# Patient Record
Sex: Female | Born: 1970 | Race: White | Hispanic: No | Marital: Single
Health system: Midwestern US, Academic
[De-identification: ages and names within clinical notes are randomized; demographics above are authoritative.]

## PROBLEM LIST (undated history)

## (undated) LAB — RENAL FUNCTION PANEL W/O EGFR
BUN: 20 mg/dL (ref 4–21)
Calcium: 8.7 mg/dL (ref 8.7–10.7)
Chloride: 104 mmol/L (ref 99–108)
Creatinine: 0.8
Potassium: 4.3 mmol/L (ref 3.4–5.3)
Sodium: 137 mmol/L (ref 137–147)

## (undated) LAB — TSH: TSH: 1.85 u[IU]/mL (ref 0.41–5.90)

## (undated) LAB — GLUCOSE, RANDOM: Glucose: 100 mg/dL (ref 60–200)

## (undated) LAB — HEMOGLOBIN A1C: Hemoglobin A1C: 5.1 % (ref 4.0–6.0)

## (undated) LAB — HM PAP SMEAR

## (undated) LAB — LIPID PANEL
Cholesterol, Total: 152
HDL: 51 mg/dL (ref 35–70)
LDL Calculated: 76 mg/dL (ref 0–160)
Triglycerides: 126 mg/dL (ref 40–160)

## (undated) LAB — HOMOCYSTEINE, SERUM: Homocysteine: 2.7

---

## 2004-10-17 NOTE — Unmapped (Signed)
Signed by Shona Needles MD on 10/17/2004 at 00:00:00  Advanced Beneficiary Notice      Imported By: Coletta Memos 05/06/2005 14:10:31    _____________________________________________________________________    External Attachment:    Please see [employer] EMR for this document.

## 2004-11-05 NOTE — Unmapped (Signed)
Signed by Shona Needles MD on 11/05/2004 at 00:00:00  Audiologic Evaluation      Imported By: Coletta Memos 02/22/2005 10:21:49    _____________________________________________________________________    External Attachment:    Please see [employer] EMR for this document.

## 2004-12-19 NOTE — Unmapped (Signed)
Signed by Shona Needles MD on 12/19/2004 at 00:00:00  Audiologic Evaluation      Imported By: Coletta Memos 05/06/2005 14:00:19    _____________________________________________________________________    External Attachment:    Please see [employer] EMR for this document.

## 2004-12-19 NOTE — Unmapped (Signed)
Signed by Shona Needles MD on 12/19/2004 at 00:00:00  [facility]      Imported By: Coletta Memos 02/22/2005 10:21:31    _____________________________________________________________________    External Attachment:    Please see [employer] EMR for this document.

## 2004-12-19 NOTE — Unmapped (Signed)
Signed by Shona Needles MD on 12/19/2004 at 00:00:00  [facility]      Imported By: Coletta Memos 02/22/2005 10:21:04    _____________________________________________________________________    External Attachment:    Please see [employer] EMR for this document.

## 2004-12-19 NOTE — Unmapped (Signed)
Signed by Shona Needles MD on 12/19/2004 at 00:00:00  Audiologic Evaluation      Imported By: Coletta Memos 02/22/2005 10:21:40    _____________________________________________________________________    External Attachment:    Please see [employer] EMR for this document.

## 2004-12-22 NOTE — Unmapped (Signed)
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

## 2005-01-27 NOTE — Unmapped (Signed)
Signed by Shona Needles MD on 02/02/2005 at 12:31:29  Patient: Nicole Chen  Note: All result statuses are Final unless otherwise noted.    Tests: (1) CT-ORB/SELLA/IAC/PFOSSA W/O C ([id number])    Order NotePricilla Handler Order Number: [id number]    Order Note:     *** VERIFIED ***  [facility]  Reason:  RT CHOLOSTEATOMA  Dict.Staff: Zachery Conch    Verified By: Zachery Conch       Ver: 01/27/05   3:43 pm  Exams:  CT-ORB/SELLA/IAC/PFOSSA W/O C      CT temporal bones without contrast 01/27/2005.    Clinical Indication: Right cholesteatoma.    Findings:    The patient was unable to be positioned for coronal imaging and  coronal reconstructions were performed. The direct axial images  are moderately contaminated by motion artifacts, which causes  essentially nondiagnostic coronal multiplanar reconstructions.    The bilateral mastoid air cells are markedly underpneumatized  and completely opacified. There is subtotal opacification of the  bilateral middle ear cavities including the middle ear recesses,  surrounding the ossicles without definite destruction. The  bilateral tegmen tympani appears thinned bilaterally, asymmetric  on the right with defects suspected.    No focal masses are appreciated. No definite otic capsule  defects are seen. No definite significant intracranial  abnormalities are included on this examination.    Impression:    Limited study demonstrating extensive opacification of the  bilateral mastoid and middle ear cavities likely due to chronic  inflammatory disease. Components of cholesteatoma are not  excluded. Bilateral tegmen tympani defects are suspected, but  poorly characterized due to the degree of motion artifacts.  **** end of result ****    Note: An exclamation mark (!) indicates a result that was not dispersed into   the flowsheet.  Document Creation Date: 01/27/2005 3:41 PM  _______________________________________________________________________    (1) Order result status:  Final  Collection or observation date-time: 01/27/2005 07:01:00  Requested date-time: 01/27/2005 07:01:00  Receipt date-time:   Reported date-time: 01/27/2005 15:43:26  Referring Physician: Cindi Carbon NON-STAFF  Ordering Physician: Chauncey Cruel [facility])  Specimen Source:   Source: Duanne Guess Order Number: [id number]  Lab site: [facility]

## 2005-01-27 NOTE — Unmapped (Signed)
Signed by Shona Needles MD on 01/27/2005 at 00:00:00  [facility]      Imported By: Coletta Memos 05/06/2005 14:09:03    _____________________________________________________________________    External Attachment:    Please see [employer] EMR for this document.

## 2005-02-08 NOTE — Unmapped (Signed)
Signed by Shona Needles MD on 02/08/2005 at 00:00:00  [facility] [employer]      Imported By: Coletta Memos 05/06/2005 14:09:22    _____________________________________________________________________    External Attachment:    Please see [employer] EMR for this document.

## 2005-03-04 NOTE — Unmapped (Signed)
Signed by Ellyn Hack on 03/04/2005 at 10:43:34    Clinical Lists Changes    Orders:  Added new Test order of MRI, Brain/ Brain Stem w/ contrast ([id number]) - Signed

## 2005-03-31 NOTE — Unmapped (Signed)
Signed by   [employer] on 04/01/2005 at 00:48:35  Patient: Nicole Chen  Note: All result statuses are Final unless otherwise noted.    Tests: (1)  (MR)    Order Note:                                      THE [facility]     PATIENT NAME:   ALLYANNA, APPLEMAN                   MR #:  [id number]  DATE OF BIRTH:  [birth year]                         ACCOUNT #:  [id number]  ADMITTING:      Kathi Ludwig A. Ninfa Linden, M.D.                ROOM #:  ATTENDING:                                         NURSING UNIT:  PRIMARY:        [name], M.D.                FC:  REFERRING:      Chauncey Cruel, M.D.                    ADMIT DATE:  03/31/2005  DICTATED BY:    Rosemarie Beath, C.N.P.             DISCHARGE DATE:                                  HISTORY & PHYSICAL     ATTENDING PHYSICIAN:  Chauncey Cruel, M.D.     CHIEF COMPLAINT:  The patient has Down's syndrome.  Most information received  today from her mother, although the patient is very interactive and  understands she is having surgery.  The mother states she is having surgery  on her right ear.     HISTORY OF PRESENT ILLNESS:  This is a 34 year old white female who has had  hearing aids since 89.  She went in for a routine ear exam and hearing  test.  She was then referred to Dr. Orlin Hilding in August, 2006.  She had a CT scan  in October, 2006 and was diagnosed with a cholesteatoma.  The patient also  has had complaints of intermittent headaches since October, 2006.  No  previous ear surgeries other than multiple PE tubes as a child.     PAST SURGICAL HISTORY:  Other than above:     1.  Heart defect repaired in 1972.     PAST MEDICAL HISTORY:  None other than above.     SOCIAL HISTORY:  The patient has Down's syndrome, moderate functioning.  She  is able to live in an apartment upstairs from her mother.  She did complete  MRDD schooling.  She is unable to read, however.  She does work at [employer] doing [employer].  She is a non-smoker.  No use of alcohol.  No use  of  recreational drugs.     FAMILY HISTORY:  None.     REVIEW OF  SYSTEMS:  The patient currently denies any chest pain, shortness of  breath, GI or GU complaints.     ALLERGIES TO MEDICATIONS:  None.     CURRENT MEDICATIONS:  None.     PHYSICAL EXAMINATION:     VITAL SIGNS:  Blood pressure 113/57, pulse 77, respirations 18, 02 sat 100%.  She is 4'8 tall, weighs 118 pounds.  GENERAL:  Well-developed, normal nutrition.  No acute distress.  She appears  very well cared for.  MENTAL STATUS:  Alert and oriented x three.  Cooperative.  CO-MORBID CONDITIONS:  Down's syndrome.  HEENT:  Normocephalic.  Pupils equal, round, reactive to light.  Oropharynx  is pink and moist.  Teeth are in good repair.  Wears bilateral hearing aids.  NECK:  Supple.  No lymphadenopathy.  No thyroid enlargement.  CHEST:  Lungs are clear to percussion and auscultation.  BREASTS:  Not assessed.  CARDIAC EXAM:  Regular rate and rhythm.  No murmurs, rubs or gallops.  ABDOMEN:  Soft.  Positive bowel sounds.  No tenderness.  RECTAL:  Not assessed.  EXTREMITIES:  No gross deformity.  Positive radial pulses.  No clubbing,  cyanosis or edema.  SKIN:  Warm and dry.  NEUROLOGICAL EXAM:  Grossly intact.     IMPRESSION:     1.  This is a 34 year old white female with cholesteatoma for right       tympanomastoidectomy and canal reconstruction.     PLAN:     1.  Continue with surgery as planned.                                              ________________________________________  MP/fm                                 ____  D:  03/31/2005 17:32                  Chauncey Cruel, M.D.  T:  04/01/2005 00:36                  Dictated by:  Rosemarie Beath, C.N.P.  Job #:  [id number]                                     HISTORY & PHYSICAL                                        COPY                   PAGE    1 of 1    Note: An exclamation mark (!) indicates a result that was not dispersed into   the flowsheet.  Document Creation Date: 04/01/2005 12:48  AM  _______________________________________________________________________    (1) Order result status: Final  Collection or observation date-time: 03/31/2005 00:00  Requested date-time:   Receipt date-time:   Reported date-time:   Referring Physician:    Ordering Physician:  Reviewed In Hospital [facility])  Specimen Source:   Source: DBS  Filler Order Number: [id number]  Lab site:

## 2005-04-08 NOTE — Unmapped (Signed)
Signed by   [employer] on 04/09/2005 at 12:06:59  Patient: Nicole Chen  Note: All result statuses are Final unless otherwise noted.    Tests: (1)  (MR)    Order Note:                                      THE [facility]     PATIENT NAME:   Nicole Chen, Nicole Chen                   MR #:  [id number]  DATE OF BIRTH:  26-Sep-1970                         ACCOUNT #:  [id number]  SURGEON:        Chauncey Cruel, M.D.                    ROOM #:  [phone]  SERVICE:        Otolaryngology                     NURSING UNIT:  [email or web address]  PRIMARY:        [name], M.D.                FCJudie Petit  REFERRING:      Chauncey Cruel, M.D.                    ADMIT DATE:  04/08/2005  DICTATED BY:    Chauncey Cruel, M.D.                    SURGERY DATE:                                                     DISCHARGE DATE:  04/09/2005                                    OPERATIVE REPORT           PRIMARY CARE PHYSICIAN:  [name] M.D.     PREOPERATIVE DIAGNOSIS(ES):     1.  Right cholesteatoma.  2.  Right conductive hearing loss.  3.  Left eustachian tube dysfunction.  4.  Left serous otitis media.  5.  Left conductive hearing loss.     POSTOPERATIVE DIAGNOSIS(ES):     1.  Right cholesteatoma.  2.  Right conductive hearing loss.  3.  Left eustachian tube dysfunction.  4.  Left serous otitis media.  5.  Left conductive hearing loss.     PROCEDURES PERFORMED:     1.  Left PE tube placement with Goode T tube.  2.  Right canal wall down tympanomastoidectomy.  3.  Canal wall reconstruction/canalplasty, cranial bone graft.  4.  Mastoid obliteration.  5.  Operative microscope.  6.  Facial nerve monitoring.     ANESTHESIA:   General endotracheal, local 1% lidocaine with 1:100,000  epinephrine.     SURGEON:  Chauncey Cruel MD     RESIDENT:  Kathy Breach, M.D.     DRAINSLolita Cram drain.     SPECIMENS:  Right middle ear mastoid contents for permanent section.     ESTIMATED BLOOD LOSS:  <50 cc.     COMPLICATIONS:  None.     INDICATIONS FOR PROCEDURE:     The patient is a  34 year old Caucasian female referred by Dr. Gweneth Dimitri to me for  evaluation of her ears. Audiogram revealed bilateral conductive hearing loss.  Clinical exam was significant for left eustachian tube dysfunction and serous  otitis media and on the right side, there was the concern of a cholesteatoma  pocket involving the posterior aspect of the pars tensa and extending up to a  portion of the pars flaccida.  CT scan and MRI scan were obtained.  Mother  provided consent for the procedure.  She understood that the risks of surgery  were less than the  risks of untreated cholesteatoma.  The risks of surgery  discussed included no guarantees of success, need for further  surgery/therapy, recurrence of ear disease, meningitis, brain fluid leak,  facial nerve paralysis, deafness, dizziness, tinnitus, taste changes, wound  healing problems as well as other unforeseen problems and complications and  despite the potential risks and complications, the patient and her mother  decided to proceed with surgery.     OPERATIVE FINDINGS:     1.  Left tympanic membrane atelectatic with serous middle ear effusion.  2.  Hypopneumotized/sclerotic mastoid on the right.  3.  Tegmen tympani dehiscence approximately 2 mm in size overlying the  head  of the malleus.  4.  Malleus removed.  5.  Incus eroded to the level of the body.  6.  Right tympanomeatal flap atelectatic.  7.  Facial nerve dehiscence, tympanic segment.  8.  Cholesteatoma involving the posterior aspect of the superior pars tensa  and posterior pars flaccida.  9.  See procedure in detail section below for more details.     DESCRIPTION OF OPERATION:     The patient was taken from the preop holding area to the operating room where  she was placed supine on the operating room table.  The patient was intubated  and once the endotracheal tube was secured, the table was rotated 180  degrees.  STDs were placed on her legs.     First her left ear was examined with the  microscope and  intraoperative  findings as described above.  A Goode T tube was placed with some slight  difficulty due to atelectasis of the tympanic membrane.   Serous middle ear  effusion was then aspirated. Floxin otic drops were placed.     Attention was then turned to the right ear, the hair shaved posterior and  superior to the right auricle.  The proposed incision site was marked with a  marking pen one fingerbreadth behind the posterior auricular sulcus.  The ear  canal was also injected.  Facial nerve monitoring needle electrodes were  applied.  The patient was given a dose of prophylactic IV antibiotics and  steroids.  She was not kept paralyzed throughout the procedure so we could  monitor facial nerve function.  She was prepped and draped in the usual  sterile fashion after being adequately padded, belted and test rolled on the  table.     A 15 blade scalpel was used to make an incision down through the skin to the  level of the temporalis fascia.  Two grafts were harvested and allowed to  dry.  Weitlaners were used to retract the soft tissue.  The operating  microscope  was brought in to view the surgical field.  Cutting core and fine  diamond burs were used as needed throughout the procedure.  A cranial bone graft was harvested from the squamosa and retrosigmoid area of  the temporal bones.  Since the mastoid was not well aerated, a lot of cranial  bone graft could be harvested without difficulty.  The cranial bone graft was  held in antibiotic irrigation.     A complete mastoidectomy was performed.  The digastric was identified.  The  sigmoid sinus was identified.  The sinodural angle was identified and cleaned  out.  There was hypertrophic mucosa and granulation tissue in the mastoid.  The external auditory canal skin was elevated.  The ear canal skin was found  to be thin with inadvertent tears created.  Due to the hypopneumotized  sclerotic mastoid being noted, the canal wall was taken down.  The chorda  tympani  nerve was identified and drilled through.  The facial nerve was  identified.  The cholesteatoma was removed from the middle ear space and sent  off for permanent section.     The stapes was found to be freed and separated from the incus.  The incus was  removed. The  malleus was medially rotated.  The malleus was completely  removed after cutting the head of the malleus and after cutting the tensor  tympani tendon.  Thus, the posterior aspect of the tympanic membrane was  resected. The cog was drilled down.  Antibiotic irrigation was used to clean  out the wound.   Intraoperative findings were as described above.   The  dehiscence of the tympanic segment of the facial nerve was identified.  The  stapes superstructure was identified.     After complete eradication of all disease, Silastic was placed in the middle  ear space, it was tear drop shape and size.  The temporalis fascia was placed  lateral to this and on top of this, was placed Gelfoam and that was used to  fill the ear canal.  Since the canal wall had been taken down, additional  fascia was used to separate the middle ear space from the mastoid to help  create a new canal wall.  The mastoid was filled with the bone pate. The  mastoid obliteration procedure was then completed.     The ear canal was then inspected after the Gelfoam was placed.  An Ambrose  ear wick was trimmed to the appropriate size and placed in the ear canal. 7-0  Vicryl sutures were used to close the palatal flap and subcutaneous tissue.  Absorbable sutures were placed in the skin.  A Penrose drain had been applied.     All sponge, needle and instrument counts were correct at the end of the case.     The patient tolerated the procedure well, was extubated and transferred to  the recovery room in stable condition.                                                                   ________________________________________  RS/a  ____  D:  04/08/2005 16:45                   Chauncey Cruel, M.D.  T:  04/09/2005 12:02  Job #:  [id number]  c:   Sol Blazing. Gweneth Dimitri, M.D.          [name], M.D.          Anesthesia                                    OPERATIVE REPORT                                        COPY                   PAGE    1 of 1    Note: An exclamation mark (!) indicates a result that was not dispersed into   the flowsheet.  Document Creation Date: 04/09/2005 12:06 PM  _______________________________________________________________________    (1) Order result status: Final  Collection or observation date-time: 04/08/2005 00:00  Requested date-time:   Receipt date-time:   Reported date-time:   Referring Physician: Chauncey Cruel  Ordering Physician:  Reviewed In Hospital [facility])  Specimen Source:   Source: DBS  Filler Order Number: [id number]  Lab site:

## 2005-05-03 NOTE — Unmapped (Signed)
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

## 2005-05-12 NOTE — Unmapped (Signed)
Signed by Shona Needles MD on 05/12/2005 at 09:06:10    [facility]    VITAL SIGNS:     Weight: 114 lbs.,  Height: 56 inches,  Pulse rate: 70,   ALLERGIES:      No Known Allergies  MEDICATIONS:    * Intake recorded by: Clelia Schaumann CMA  May 12, 2005 8:51 AM      POST-PROCEDURE VISIT   Date of procedure: 04/08/2005  Procedure: tymp-mastoid--right; mastoid obliteration      Assessment and Plan   Nicole Chen is a 35 Year old Female seen today for postop check  S/p left PEtube placement  S/p Right CWU / CWR mastoidectomy, mastoid obliteration  FN fx 1/6  One more week of dry ear precautioons  One more week for ear drops (due to dry wax, gelfoam in ear)  May use hearing aid in right ear after one more week  F/u 3-4 months with hearing test  Will plan 2nd stage at that time    Plan    Orders for today's visit  1. Ordered [id number] - Postop followup visit [[id number]]          [name],MD

## 2005-08-02 NOTE — Unmapped (Signed)
Signed by Shona Needles MD on 08/02/2005 at 14:32:24    [facility]        HISTORY OF PRESENT ILLNESS   Consult Request from: Nicole Chen  Chief Complaint: hearing loss  Medical history questionnaire was reviewed by me with the patient. Review of systems is negative except as noted in HPI.  Medical history questionnaire was reviewed by me with the patient. Review of systems is negative except as noted on scanned document.  Medical history was reviewed by me.  35yrold WF  Here with mom  4 months out from right tympanomastoidectomy, left PEtube  No otorrhea  No dizziness  No otalgia  Audio done today  PAST SURGICAL HISTORY:  Other than above:     1.  Heart defect repaired in 1972, Down's syndrome     PAST MEDICAL HISTORY:  None other than above.     SOCIAL HISTORY:  The patient has Down's syndrome, moderate functioning.  She  is able to live in an apartment upstairs from her mother.  She did complete  [employer] schooling.  She is unable to read, however.  She does work at [employer] doing [employer].  She is a non-smoker.  No use of alcohol.  No use of  recreational drugs.     FAMILY HISTORY:  None.      VITAL SIGNS:     Weight: 112 lbs.,  Height: 56 inches,   ALLERGIES:      No Known Allergies  MEDICATIONS:    * Intake recorded by: Sheral Flow RMA  August 02, 2005 1:35 PM    PHYSICAL EXAM:  GENERAL:Well-developed, well-nourished Undetermined Female in no acute distress.ability to converse.  LYMPHATIC:No lymphadenopathy on neck palpation.  PSYCHIATRIC:NL mood and affect.x 4.  NECK:Overall appearance NL. No masses.symmetric with NL tracheal position. No crepitus.masses, tenderness, or enlargement of thyroid on palpation.glands NL size.  HEAD AND FACE:No sinus tenderness.  GNF:AOZHYQ motility NL.erythema or infection.  NOSE:Anterior rhinoscopy performed. Mucosa intact. No masses, polyps, or pus.septal perforation.External nose without infection or  abnormality.  OC/OP:Lips, teeth, and gums in good condition.No leukoplakia or masses.palates and tongue of NL symmetry.walls and tonsillar fossae without abnormalities.  CARDIOVASCULAR:Examination of the carotid arteries upon palpation reveals no increase in pulse amplitude.  SKIN:Visual examination and palpation of the scalp, neck, and head reveals no significant rashes, ulcerations, nodules, or lesions.  EARS:External ears have a normal appearance with no masses, lesions, or scars. Left postauricular incision well-healed.  NEUROLOGIC:Facial nerve function grade I.cranial nerves grossly WNL.    PROCEDURE NOTE: Otomicroscopic exam was done of both EAC's; wax in both eac's; pe tube in left EAC    ASSESSMENT/ PLAN:  Bilateral CHL, ETD, right cholesteatoma  Plan: June- 2nd look procedure right ear (transcanal vs. postauricular), cartilage graft, ossiculoplasty, left PEtube removal from EAC and possible PEtube replacement with Goode t-tube.  Outpatient, 2 hours.            Medical Decision Making:   * discussed case with another provider    Patient Education:   * verbal information and/or pamphlet/ literature given to patient.    Risks & Benefits:   * Risks, benefits and treatment options discussed with patient.      Plan           [name],MD

## 2005-08-05 NOTE — Unmapped (Addendum)
Signed by Jeanmarie Plant on 08/05/2005 at 17:38:45    [facility]        ENT SURGERY [facility] FORM   Surgery Date: 10/07/2005  Requested Time: [facility]  RNS  PRE-OP AUDIO? Yes  Comments:DR [facility]  [phone]/  [phone]  Latex Sensitive: No  Procedure:  2ND LOOD R TRANSCANAL VS POSTAURICULAR  Procedure: CARTILAGE GRAFT  Procedure: OSSICULOPLASTY  Comments: L PE TUBE REMOVAL FROM EAC/ POSS PE TUBE REPLACEMENT C GOODE T TUBE  Hospital Preference: [facility]  Anesthesia: GA  Is Anesthesia Consult Needed? No  Is Cardiac Consult Needed? No  Type of Admit: OP  Consent Signed? Yes  Confirmation #: [id number]  Instructions given to Patient?  Yes  H&P: DR [facility]  Labs: DR [facility]    Patient Information   Name: PRESLI FANGUY  DOB: 12-03-1970  Address: [address]  [place], [place]  [address]  Gender: Female  Home phone: [phone]  IDX #: [id number]  Last Word #: [id number]    Signed by Jeanmarie Plant on 12/02/2005 at 09:09:17    RESCHEDULED TO 08.23.07 AT 7:30 AM  Signed by Jeanmarie Plant on 12/09/2005 at 13:55:38    NO PRE OP AUDIO.

## 2005-09-01 NOTE — Unmapped (Signed)
Signed by Benito Mccreedy Deters on 09/01/2005 at 14:10:20        [facility], [facility]   [address]  [place], [place] [address]   [phone]  Fax: [phone]     August 02, 2005    [name], MD  [address]  [address]  [place], [place] [address]  Fax: [phone]    RE:  Nicole Chen   DOB:  03-28-1971    Dear Dr. Valla Leaver:    Just wanted to update you on [facility], who was in to see me today.     ASSESSMENT AND PLAN    Nicole Chen is a 35 Year old Female seen today for postop check  S/p left PEtube placement  S/p Right CWU / CWR mastoidectomy, mastoid obliteration  FN fx 1/6  One more week of dry ear precautions  One more week for ear drops (due to dry wax, gelfoam in ear)  May use hearing aid in right ear after one more week  F/u 3-4 months with hearing test  Will plan 2nd stage at that time    Thank you for allowing me to participate in the care of this pleasant patient.  Please feel free to contact me should you have any concerns or questions.    Sincerely,      Shona Needles, M.D., F.A.C.S.   Assistant Professor   Department of Otolaryngology  Pediatric and Adult Otology/Neurotology

## 2005-10-26 ENCOUNTER — Inpatient Hospital Stay

## 2005-11-23 NOTE — Unmapped (Signed)
Signed by   LinkLogic on 11/23/2005 at 16:26:15    Appointment status changed to no show by  LinkLogic on 11/23/2005 4:26 PM.    No Show Comments  ----------------  (MOF) HEARING AID    Appointment Information  -----------------------  Appt Type:         Date:  Tuesday, November 23, 2005       Time:  4:00 PM for 60 min    Urgency:  Routine    Made By:  Pauline Aus MA   To Visit:  Nicole Chen A [name]     Reason:  (MOF) HEARING AID    Appt Comments  -------------  -- 11/23/05 16:26: (GILPINPI) NO SHOW --  (MOF) HEARING AID  HEARING AID CHECK    -- 11/08/05 10:19: (HOUSTOLM) BOOKED --  Routine  at 11/23/2005 4:00 PM for 60 min  (MOF) HEARING AID  HEARING AID CHECK

## 2005-12-02 NOTE — Unmapped (Signed)
Signed by Jeanmarie Plant on 12/02/2005 at 09:13:17    Phone Note   Outgoing Call    Call placed by: Jeanmarie Plant,  December 02, 2005 9:10 AM  Call placed to: [facility] - [facility]  Details for Reason: H&P BY PMD  Action Taken: phone call completed  Summary of call: CALLED AND LEFT MSSG AT HOME #: SINCE SEVERAL DATE CHANGES OF SURGERY - REMINDED THAT H&P CAN NOT BE DONE EARLIER THAT 07.23.07.

## 2005-12-09 LAB — CBC AND DIFFERENTIAL
Basophils Absolute: 63 K/uL (ref 0–200)
Basophils Relative: 1 %
Eosinophils Absolute: 44 10*3/mm3 (ref 15–500)
Eosinophils Relative: 0.7 %
Hematocrit: 43.5 % (ref 35.0–45.0)
Hemoglobin: 14.9 g/dL (ref 11.7–15.5)
Lymphocytes Absolute: 1796 10*3/mm3 (ref 850–3900)
Lymphocytes Relative: 28.5 %
MCH: 35.1 pg — ABNORMAL HIGH (ref 27.0–33.0)
MCHC: 34.4 g/dL (ref 32.0–36.0)
MCV: 102.2 fL — ABNORMAL HIGH (ref 80.0–100.0)
MPV: 7.1 fL — ABNORMAL LOW (ref 7.5–11.5)
Monocytes Absolute: 454 10*3/microliter (ref 200–950)
Monocytes Relative: 7.2 %
Neutrophils Absolute: 3944 K/uL (ref 1500–7800)
Neutrophils Relative: 62.6 %
Platelets: 421 10*3/mm3 — ABNORMAL HIGH (ref 140–400)
RBC: 4.26 10*6/mm3 (ref 3.80–5.10)
RDW: 13.8 % (ref 11.0–15.0)
WBC: 6.3 10*3/mm3 (ref 3.8–10.8)

## 2005-12-09 LAB — HEPATIC FUNCTION PANEL
A/G Ratio: 1.8 (ref 1.0–2.1)
ALT: 15 units/L (ref 6–40)
AST: 17 units/L (ref 10–30)
Albumin: 4.5 g/dL (ref 3.6–5.1)
Alkaline Phosphatase: 51 units/L (ref 33–115)
Bilirubin, Direct: 0.2 mg/dL (ref <=0.2)
Bilirubin, Indirect: 0.7 mg/dL (ref 0.2–1.2)
Globulin, Total: 2.5 g/dL (ref 2.2–3.9)
Total Bilirubin: 0.9 mg/dL (ref 0.2–1.2)
Total Protein: 7 g/dL (ref 6.2–8.3)

## 2005-12-09 LAB — PROTIME-INR
INR: 1
Protime: 10.5 s (ref 9.0–11.5)

## 2005-12-09 LAB — BASIC METABOLIC PANEL
BUN/Creatinine Ratio: 20 (ref 6–22)
BUN: 16 mg/dL (ref 7–25)
CO2: 22 mmol/L (ref 21–33)
Calcium: 9 mg/dL (ref 8.6–10.2)
Chloride: 103 mmol/L (ref 98–110)
Creatinine: 0.8 mg/dL (ref 0.5–1.2)
GFR MDRD Non Af Amer: >60 mL/min (ref >=60)
Glucose: 92 mg/dL (ref 65–99)
Potassium: 4 mmol/L (ref 3.5–5.3)
Sodium: 136 mmol/L (ref 135–146)

## 2005-12-09 LAB — APTT: aPTT: 30 (ref 22–36)

## 2005-12-09 NOTE — Unmapped (Signed)
Signed by   LinkLogic on 12/10/2005 at 07:24:03  Patient: [facility]  Note: All result statuses are Final unless otherwise noted.    Tests: (1) CBC (INCLUDES DIFF/PLT) (QDL-6399)   WHITE BLOOD CELL COUNT                              6.3 Thousand/uL             3.8-10.8    RED BLOOD CELL COUNT      4.26 Million/uL             3.80-5.10    HEMOGLOBIN                14.9 g/dL                   57.8-46.9    HEMATOCRIT                43.5 %                      35.0-45.0    MCV                  [H]  102.2 fL                    80.0-100.0    MCH                  [H]  35.1 pg                     27.0-33.0    MCHC                      34.4 g/dL                   62.9-52.8    RDW                       13.8 %                      11.0-15.0    PLATELET COUNT       [H]  421 Thousand/uL             140-400    MPV                  [L]  7.1 fL                      7.5-11.5    ABSOLUTE NEUTROPHILS      3944 cells/uL               1500-7800    ABSOLUTE LYMPHOCYTES      1796 cells/uL               [phone]    ABSOLUTE MONOCYTES        454 cells/uL                200-950    ABSOLUTE EOSINOPHILS      44 cells/uL                 15-500    ABSOLUTE BASOPHILS        63 cells/uL                 0-200  NEUTROPHILS               62.6 %    LYMPHOCYTES               28.5 %    MONOCYTES                 7.2 %    EOSINOPHILS               0.7 %    BASOPHILS                 1.0 %    Note: An exclamation mark (!) indicates a result that was not dispersed into   the flowsheet.  Document Creation Date: 12/10/2005 7:24 AM  _______________________________________________________________________    (1) Order result status: Final  Collection or observation date-time: 12/09/2005 16:49  Requested date-time:   Receipt date-time: 12/09/2005 21:11  Reported date-time: 12/10/2005 07:00  Referring Physician:    Ordering Physician: [name] [facility])  Specimen Source: B  Source: Lucien Mons Order Number: [id number]  Lab site: [facility] [place]      [address]        [facility]  [address]      -----------------    The following lab values were dispersed to the flowsheet  with no units conversion:      WHITE BLOOD CELL COUNT, 6.3 THOUSAND/UL, (F)  expected units: 10*3/mm3    RED BLOOD CELL COUNT, 4.26 MILLION/UL, (F)  expected units: 10*6/mm3    PLATELET COUNT, 421 THOUSAND/UL, (F)  expected units: 10*3/mm3    ABSOLUTE NEUTROPHILS, 3944 CELLS/UL, (F)  expected units: K/uL    ABSOLUTE LYMPHOCYTES, 1796 CELLS/UL, (F)  expected units: 10*3/mm3    ABSOLUTE MONOCYTES, 454 CELLS/UL, (F)  expected units: 10*3/microliter    ABSOLUTE EOSINOPHILS, 44 CELLS/UL, (F)  expected units: 10*3/mm3    ABSOLUTE BASOPHILS, 63 CELLS/UL, (F)  expected units: K/uL

## 2005-12-09 NOTE — Unmapped (Signed)
Signed by   LinkLogic on 12/10/2005 at 07:24:00  Patient: Nicole Chen  Note: All result statuses are Final unless otherwise noted.    Tests: (1) BASIC METABOLIC PANEL W/EGFR (QDL-10165)    GLUCOSE                   92 mg/dL                    14-78                  FASTING REFERENCE INTERVAL    UREA NITROGEN (BUN)       16 mg/dL                    2-95    CREATININE                0.8 mg/dL                   6.2-1.3    GFR ESTIMATED             >60 mL/min/1.10m2           > OR = 60      IF THE PATIENT IS AFRICAN-AMERICAN, PLEASE MULTIPLY      THIS RESULT BY 1.21. THIS RESULT HAS BEEN CALCULATED      ASSUMING THE PATIENT IS NON-AFRICAN AMERICAN.    BUN/CREATININE RATIO (calc)                              20                          6-22    SODIUM                    136 mmol/L                  135-146    POTASSIUM                 4.0 mmol/L                  3.5-5.3    CHLORIDE                  103 mmol/L                  98-110    CARBON DIOXIDE            22 mmol/L                   21-33    CALCIUM                   9.0 mg/dL                   0.8-65.7    Note: An exclamation mark (!) indicates a result that was not dispersed into   the flowsheet.  Document Creation Date: 12/10/2005 7:24 AM  _______________________________________________________________________    (1) Order result status: Final  Collection or observation date-time: 12/09/2005 16:49  Requested date-time:   Receipt date-time: 12/09/2005 21:11  Reported date-time: 12/10/2005 07:00  Referring Physician:    Ordering Physician: [name] [facility])  Specimen Source: S  Source: Lucien Mons Order Number: [id number]  Lab site: [facility] [place]  [address]      [place]  [place]  [address]

## 2005-12-09 NOTE — Unmapped (Signed)
Signed by   LinkLogic on 12/10/2005 at 07:24:01  Patient: Nicole Chen  Note: All result statuses are Final unless otherwise noted.    Tests: (1) HEPATIC FUNCTION PANEL (QDL-10256)    PROTEIN, TOTAL            7.0 g/dL                    6.9-6.2    ALBUMIN                   4.5 g/dL                    9.5-2.8    GLOBULIN (calc)           2.5 g/dL                    4.1-3.2   ALBUMIN/GLOBULIN RATIO (calc)                              1.8                         1.0-2.1    BILIRUBIN, TOTAL          0.9 mg/dL                   4.4-0.1    BILIRUBIN, DIRECT         0.2 mg/dL                   < OR = 0.2   BILIRUBIN, INDIRECT (calc)                              0.7 mg/dL                   0.2-7.2    ALKALINE PHOSPHATASE      51 U/L                      33-115    AST                       17 U/L                      10-30    ALT                       15 U/L                      6-40    Note: An exclamation mark (!) indicates a result that was not dispersed into   the flowsheet.  Document Creation Date: 12/10/2005 7:24 AM  _______________________________________________________________________    (1) Order result status: Final  Collection or observation date-time: 12/09/2005 16:49  Requested date-time:   Receipt date-time: 12/09/2005 21:11  Reported date-time: 12/10/2005 07:00  Referring Physician:    Ordering Physician: [name] [facility])  Specimen Source: S  Source: Lucien Mons Order Number: [id number]  Lab site: [facility] [place]      [address]      [place]  [place]  [address]

## 2005-12-09 NOTE — Unmapped (Signed)
Signed by   LinkLogic on 12/10/2005 at 07:24:04  Patient: [facility]  Note: All result statuses are Final unless otherwise noted.    Tests: (1) PROTHROMBIN TIME WITH INR (MVH-8469)   INTERNATIONAL NORMALIZED RATIO (INR)                              1.0                INR REFERENCE INTERVAL APPLIES TO PATIENTS                NOT ON ANTICOAGULANT THERAPY:                    0.9- 1.1                       SUGGESTED INR THERAPEUTIC RANGE FOR ORAL                ANTICOAGULANT THERAPY (STABLY ANTICOAGULATED                PATIENTS)                                 ROUTINE THERAPY:                       2.0- 3.0                                 RECURRENT MYOCARDIAL INFARCTION                          OR MECHANICAL PROSTHETIC VALVES:       2.5- 3.5                  PROTHROMBIN TIME          10.5 sec                    9.0-11.5    Note: An exclamation mark (!) indicates a result that was not dispersed into   the flowsheet.  Document Creation Date: 12/10/2005 7:24 AM  _______________________________________________________________________    (1) Order result status: Final  Collection or observation date-time: 12/09/2005 16:49  Requested date-time:   Receipt date-time: 12/09/2005 21:11  Reported date-time: 12/10/2005 07:00  Referring Physician:    Ordering Physician: [name] [facility])  Specimen Source: P  Source: Lucien Mons Order Number: [id number]  Lab site: [facility]       [address]      [place]  [place]  [address]

## 2005-12-09 NOTE — Unmapped (Signed)
Signed by   LinkLogic on 12/10/2005 at 07:24:02  Patient: Nicole Chen  Note: All result statuses are Final unless otherwise noted.    Tests: (1) PARTIAL THROMBOPLASTIN TIME, ACTIVATED (QDL-763)   PARTIAL THROMBOPLASTIN TIME, ACTIVATED                              30 sec                      22-36             THE THERAPEUTIC RANGE FOR UNFRACTIONATED HEPARIN      THERAPY IS 1.5-2.5 TIMES THE MEAN OF THE REFERENCE      INTERVAL.      IN PATIENTS IN WHOM THERE IS AN APPARENT HEPARIN      RESISTANCE, A HEPARIN LEVEL BY AN ANTI-XA METHOD      IS AVAILABLE.    Note: An exclamation mark (!) indicates a result that was not dispersed into   the flowsheet.  Document Creation Date: 12/10/2005 7:24 AM  _______________________________________________________________________    (1) Order result status: Final  Collection or observation date-time: 12/09/2005 16:49  Requested date-time:   Receipt date-time: 12/09/2005 21:11  Reported date-time: 12/10/2005 07:00  Referring Physician:    Ordering Physician: [name] [facility])  Specimen Source: P  Source: Lucien Mons Order Number: [id number]  Lab site: [facility] [place]      [address]      [place]  [facility]  [address]      -----------------    The following lab values were dispersed to the flowsheet  with no units conversion:      PARTIAL THROMBOPLASTIN TIME, ACTIVATED, 30 SEC, (F)  expected units: none

## 2005-12-13 NOTE — Unmapped (Signed)
Signed by Shona Needles MD on 12/13/2005 at 00:00:00  Operative Consent      Imported By: Maryellen Pile 12/15/2005 11:17:57    _____________________________________________________________________    External Attachment:    Please see [employer] EMR for this document.

## 2005-12-13 NOTE — Unmapped (Signed)
Signed by Shona Needles MD on 12/13/2005 at 14:19:49    [facility]        HISTORY OF PRESENT ILLNESS   Consult Request from: [name]  Chief Complaint: hearing test, cholesteatoma  12/13/2005    VITAL SIGNS:     Weight (lbs.): 125,  Height: 56 inches, BMI: 28.13  BSA: 1.46  Wt chg (lbs): 13    ALLERGIES:      No Known Allergies  MEDICATIONS:    * Intake recorded by: Ilean China LPN  December 13, 2005 2:06 PM      Medical Decision Making:   * review/order other diagnostic or tx interventions  * discussed case with another provider    Patient Education:   * verbal information and/or pamphlet/ literature given to patient.    Risks & Benefits:   * Risks, benefits and treatment options discussed with patient.    Assessment and Plan   ALMINA is a 35 Year old Female seen today for left Eustachian tube dysfunction, right cholesteatoma, bilateral conductive hearing loss  Procedure note: bilateral otomicroscopy performed, right TM atelectatic with minimal mobility, wax impaction in both ears removed with curettes, left PE tube in EAC  Plan: replacement of left PE tube (Goode t-tube), right transcanal / postauricular 2nd look with cartilage graft, ossiculoplasty with prosthesis  Discussed options of hearing aid vs. surgery  Risks discussed: allergic reactions, anesthetic complications, need for hearing aid, deafness, dizziness, tinnitus, taste changes, recurrence of ear disease, extruded prosthesis, no guarantee of success, facial nerve paralysis, as well as other unforeseen problems/ complications.  Despite potential risks and complications, pt and mother desire to proceed.  All questions answered to their satisfaction.  Repeat audio today.   [name],MD

## 2005-12-13 NOTE — Unmapped (Signed)
Signed by Shona Needles MD on 12/13/2005 at 00:00:00  Audiologic Evaluation      Imported By: Maryellen Pile 12/15/2005 11:18:51    _____________________________________________________________________    External Attachment:    Please see [employer] EMR for this document.

## 2005-12-15 NOTE — Unmapped (Signed)
Signed by Shona Needles MD on 12/15/2005 at 00:00:00  Pre-Op Antibiotic Prophylaxis      Imported By: Coletta Memos 12/29/2005 09:55:21    _____________________________________________________________________    External Attachment:    Please see [employer] EMR for this document.

## 2005-12-16 ENCOUNTER — Inpatient Hospital Stay: Attending: Otology & Neurotology

## 2005-12-16 NOTE — Unmapped (Signed)
Signed by   LinkLogic on 12/16/2005 at 14:19:55  Patient: Nicole Chen  Note: All result statuses are Final unless otherwise noted.    Tests: (1)  (MR)    Order Note:                                      [facility]     PATIENT NAME:   Nicole, Chen                   MR #:  [id number]  DATE OF BIRTH:  1970/11/14                         ACCOUNT #:  [id number]  SURGEON:        Chauncey Cruel, M.D.                    ROOM #:  SDS  SERVICE:        Otolaryngology                     NURSING UNIT:  [facility]  PRIMARY:        [name], M.D.                FC:  M  REFERRING:      Chauncey Cruel, M.D.                    ADMIT DATE:  12/16/2005  DICTATED BY:    Chauncey Cruel, M.D.                    SURGERY DATE:  12/16/2005                                                     DISCHARGE DATE:                                    OPERATIVE REPORT     PREOPERATIVE DIAGNOSIS(ES):     1. Right cholesteatoma.  2. Bilateral conductive hearing loss.  3. Left external auditory canal foreign body.  4. Left eustachian tube dysfunction.     POSTOPERATIVE DIAGNOSIS(ES):     1. Right cholesteatoma.  2. Bilateral conductive hearing loss.  3. Left external auditory canal foreign body.  4.   Left eustachian tube dysfunction.     PROCEDURE(S) PERFORMED:     1. Right tympanoplasty with prosthesis, cartilage graft.  2. Left external auditory canal foreign body removal PE tube (Goode T-tube).  3. Operating microscope.  4. Facial nerve monitoring.     SURGEON:  Chauncey Cruel MD     ASSISTANT:     ANESTHESIA:  Dr. Lelon Huh, general endotracheal; local 3 mL 1% lidocaine  with 1:100,000 epinephrine.     DRAINS:  None.     SPECIMENS SUBMITTED:   None.     TUBES:  None.     ESTIMATED BLOOD LOSS:  Minimal.     COMPLICATIONS:  None.     POSTOPERATIVE CONDITION:  Stable.     ANTIBIOTICS:  1 g IV cefazolin, Avelox topical irrigation.  STEROIDS:  8 mg IV Decadron.     INDICATIONS FOR PROCEDURE:  The patient is a 35 year old Caucasian female,  well-known to  me.  She has a history of Down's syndrome. She has had numerous  ear problems for a long time.  She is referred to me by Dr. Gweneth Dimitri.  At the  first stage on the right ear I had done a tympanomastoidectomy with mastoid  obliteration for cholesteatoma.  She did well from that procedure.  The plan  was to do a second look procedure, assess for recurrence of cholesteatoma,  but an artificial prosthesis to improve her hearing. Also her tube that been  placed during the first procedure on the left had extruded and was in the ear  canal, filled with wax.  The plan was to remove the canal debris and foreign  body and then put a new tube on the left side.  Options of observation versus  surgery were discussed with the patient and her mother, who was the legal  guardian.  The mom wanted to proceed with surgery.  We discussed options of  hearing aid for the right side. All questions were answered to the patient's  and mother's satisfaction.  Mom wanted to proceed with surgery despite  potential risks of anesthetic complications, allergic reactions, no guarantee  of success, recurrence of ear disease, stroke, heart attack, death, brain  fluid leak, meningitis, deafness, dizziness, tinnitus, taste changes, wound  healing problems, facial nerve paralysis as well as other unforeseen problems  and complications.  Despite potential risks and complications mom desired to  proceed with surgery.     INTRAOPERATIVE FINDINGS:     1. No evidence of recurrent cholesteatoma.  2. Stapes superstructure present, silastic removed, Goldenberg HA PROP placed     with only the HA portion of it placed on the stapes, cartilage graft was     then placed on top of this.  3. Left ear canal filled with cerumen and old Goode T-tube.  Residual     perforation noted from the prior PE tube placement.   A new PE tube placed     at this perforation site.  4. See procedure in detail section below for more details.     PLAN:     1. If the patient has significant  persistent conductive hearing loss she will     undergo  hearing aid placement on the right side.     DETAILS OF PROCEDURE:  The patient was taken from the preoperative holding  area to the operating room  where she was placed supine on the operating  table.  The patient was intubated.  Once the endotracheal tube was secured,  the table was rotated 180 degrees.  SCDs were placed on the patient's legs.  She was adequately padded, belted, and test rolled to make sure she was in  good position on the table.  Facial nerve monitoring needle electrodes were  adequately placed and assessed for proper functioning.  The microscope was  brought in to view the surgical field.  The left ear canal debris and tube  were removed with alligator forceps.  Intraoperative findings were as  described above.  After the ear canal was cleared a tympanic membrane  perforation was noted.  The Goode tube was trimmed and terms of both the  flanges and the shaft and then placed into the prior PE tube perforation  site.  Attention was then turned to the right ear.  The  patient was  adequately prepped and draped in the standard surgical fashion.  A 15 blade  scalpel was used to make an inverted U incision on tragal cartilage graft  harvest site.  Tragal cartilage was harvested about the medial two-thirds of  this.  The medial two-thirds of the cartilage was obtained with the  perichondrium.  The inverted U flap was then replaced. The  microscope was  brought into view the surgical field.  The ear canal was injected with local  and then antibiotic irrigation was used to clean out the right ear.  A sickle  knife was used to make an inferior vertical incision.  A round knife was then  used to make the horizontal incision which was then carried superiorly.  The  middle ear space was then entered.  The old silastic was removed.  The  staples was identified.  No cholesteatoma was noted.  The prosthesis was  trimmed to appropriate size.  Only the HA  head  could be used.  This was  placed on to of the stapes.  On top of this was placed cartilage which was  trimmed.  The tympanomeatal flap was short, perichondrium was used to replace  any portions of shortened tympanic membrane.  This was done on top of  Gelfoam.  Intraoperative photos were taken.  The ear canal was filled with  Gelfoam followed by a cotton ball with antibiotic ointment on it.  All  sponge, needle, and instrument counts were correct at the end of the case.  The patient tolerated the procedure well, was extubated, and transferred to  the recovery room in stable condition.  Intraoperative findings were  discussed with the mother in the conference room and photos of the procedure  were given.                                                       ________________________________________  RS/cmb                                ____  D:  12/16/2005 09:28                  Chauncey Cruel, M.D.  T:  12/16/2005 14:14  Job #:  [id number]  c:   Sol Blazing. Gweneth Dimitri, M.D.          [name], M.D.          Anesthesia                                    OPERATIVE REPORT                                        COPY                   PAGE    1 of 1    Note: An exclamation mark (!) indicates a result that was not dispersed into   the flowsheet.  Document Creation Date: 12/16/2005 2:19 PM  _______________________________________________________________________    (1) Order result status: Final  Collection or observation  date-time: 12/16/2005 00:00  Requested date-time:   Receipt date-time:   Reported date-time:   Referring Physician: Chauncey Cruel  Ordering Physician:  [facility]ed [facility])  Specimen Source:   Source: DBS  Filler Order Number: [id number]  Lab site:

## 2005-12-16 NOTE — Unmapped (Signed)
Signed by Shona Needles MD on 12/16/2005 at 00:00:00  Operative Photo ENT      Imported By: Coletta Memos 01/14/2006 11:09:59    _____________________________________________________________________    External Attachment:    Please see [employer] EMR for this document.

## 2005-12-16 NOTE — Unmapped (Signed)
THE [facility] PATIENT NAME:   Nicole Chen, Nicole Chen                   MR #:  [id number] DATE OF BIRTH:  09-16-1970                           ACCOUNT #:  [id number] SURGEON:        Chauncey Cruel, M.D.                      ROOM #:  SDS SERVICE:        Otolaryngology                       NURSING UNIT:  [facility] PRIMARY:        [name], M.D.                FCJudie Petit   REFERRING:      Chauncey Cruel, M.D.                    ADMIT DATE:  12/16/2005   DICTATED BY:    Chauncey Cruel, M.D.                    SURGERY DATE:  12/16/2005                                                      DISCHARGE DATE:                                  OPERATIVE REPORT PREOPERATIVE DIAGNOSIS(ES): 1. Right   cholesteatoma. 2. Bilateral conductive hearing loss. 3. Left external auditory   canal foreign body. 4. Left eustachian tube dysfunction. POSTOPERATIVE   DIAGNOSIS(ES): 1. Right cholesteatoma. 2. Bilateral conductive hearing loss.   3. Left external auditory canal foreign body. 4.   Left eustachian tube   dysfunction. PROCEDURE(S) PERFORMED: 1. Right tympanoplasty with prosthesis,   cartilage graft. 2. Left external auditory canal foreign body removal PE tube   (Goode T-tube). 3. Operating microscope. 4. Facial nerve monitoring. SURGEON:    Chauncey Cruel MD ASSISTANT: ANESTHESIA:  Dr. Lelon Huh, general endotracheal;   local 3 mL 1% lidocaine with 1:100,000 epinephrine. DRAINS:  None. SPECIMENS   SUBMITTED:   None. TUBES:  None. ESTIMATED BLOOD LOSS:  Minimal.   COMPLICATIONS:  None. POSTOPERATIVE CONDITION:  Stable. ANTIBIOTICS:  1 g IV   cefazolin, Avelox topical irrigation. STEROIDS:  8 mg IV Decadron. INDICATIONS   FOR PROCEDURE:  The patient is a 35 year old Caucasian female, well-known to   me.  She has a history of Down's syndrome. She has had numerous ear problems   for a long time.  She is referred to me by Dr. Gweneth Dimitri.  At the first stage on   the right ear I had done a tympanomastoidectomy with mastoid  obliteration for   cholesteatoma.  She did well from that procedure.  The plan was to do a second   look procedure, assess for recurrence of cholesteatoma, but an artificial   prosthesis to improve her hearing. Also her tube that been placed during the   first  procedure on the left had extruded and was in the ear canal, filled with   wax.  The plan was to remove the canal debris and foreign body and then put a   new tube on the left side.  Options of observation versus surgery were   discussed with the patient and her mother, who was the legal guardian.  The   mom wanted to proceed with surgery.  We discussed options of hearing aid for   the right side. All questions were answered to the patient's and mother's   satisfaction.  Mom wanted to proceed with surgery despite potential risks of   anesthetic complications, allergic reactions, no guarantee of success,   recurrence of ear disease, stroke, heart attack, death, brain fluid leak,   meningitis, deafness, dizziness, tinnitus, taste changes, wound healing   problems, facial nerve paralysis as well as other unforeseen problems and   complications.  Despite potential risks and complications mom desired to   proceed with surgery. INTRAOPERATIVE FINDINGS: 1. No evidence of recurrent   cholesteatoma. 2. Stapes superstructure present, silastic removed, Goldenberg   HA PROP placed    with only the HA portion of it placed on the stapes,   cartilage graft was    then placed on top of this. 3. Left ear canal filled   with cerumen and old Goode T-tube.  Residual    perforation noted from the   prior PE tube placement.   A new PE tube placed    at this perforation site.   4. See procedure in detail section below for more details. PLAN: 1. If the   patient has significant persistent conductive hearing loss she will    undergo    hearing aid placement on the right side. DETAILS OF PROCEDURE:  The patient   was taken from the preoperative holding area to the operating room  where  she   was placed supine on the operating table.  The patient was intubated.  Once   the endotracheal tube was secured, the table was rotated 180 degrees.  SCDs   were placed on the patient's legs. She was adequately padded, belted, and test   rolled to make sure she was in good position on the table.  Facial nerve   monitoring needle electrodes were adequately placed and assessed for proper   functioning.  The microscope was brought in to view the surgical field.  The   left ear canal debris and tube were removed with alligator forceps.    Intraoperative findings were as described above.  After the ear canal was   cleared a tympanic membrane perforation was noted.  The Goode tube was trimmed   and terms of both the flanges and the shaft and then placed into the prior PE   tube perforation site.  Attention was then turned to the right ear.  The   patient was adequately prepped and draped in the standard surgical fashion.  A   15 blade scalpel was used to make an inverted U incision on tragal cartilage   graft harvest site.  Tragal cartilage was harvested about the medial   two-thirds of this.  The medial two-thirds of the cartilage was obtained with   the perichondrium.  The inverted U flap was then replaced. The  microscope was   brought into view the surgical field.  The ear canal was injected with local   and then antibiotic irrigation was used to clean out the right ear.  A sickle   knife was used to make an inferior vertical incision.  A round knife was then   used to make the horizontal incision which was then carried superiorly.  The   middle ear space was then entered.  The old silastic was removed.  The staples   was identified.  No cholesteatoma was noted.  The prosthesis was trimmed to   appropriate size.  Only the HA head  could be used.  This was placed on to of   the stapes.  On top of this was placed cartilage which was trimmed.  The   tympanomeatal flap was short, perichondrium was used to replace any  portions   of shortened tympanic membrane.  This was done on top of Gelfoam.    Intraoperative photos were taken.  The ear canal was filled with Gelfoam   followed by a cotton ball with antibiotic ointment on it.  All sponge, needle,   and instrument counts were correct at the end of the case. The patient   tolerated the procedure well, was extubated, and transferred to the recovery   room in stable condition.  Intraoperative findings were discussed with the   mother in the conference room and photos of the procedure were given.                                         ________________________________________ RS/cmb                                  ____ D:  12/16/2005 09:28                  Chauncey Cruel, M.D. T:  12/16/2005 14:14 Job #:  [id number] c:   Sol Blazing. Gweneth Dimitri, M.D.        [name], M.D.      Anesthesia                                OPERATIVE   REPORT                                       COPY                   PAGE    1   of 1 T:  12/16/2005 14:14 Job #:  [id number] c:   Sol Blazing. Gweneth Dimitri, M.D.      [name], M.D.      Anesthesia                                OPERATIVE REPORT                                         COPY                   PAGE    1 of 1

## 2005-12-16 NOTE — Unmapped (Signed)
Signed by Shona Needles MD on 12/16/2005 at 00:00:00  Operative Note      Imported By: Coletta Memos 01/14/2006 10:13:21    _____________________________________________________________________    External Attachment:    Please see [employer] EMR for this document.

## 2005-12-17 NOTE — Unmapped (Signed)
Signed by Clelia Schaumann CMA on 12/17/2005 at 11:45:04    Phone Note   Other Incoming Call    Caller's Name: [name]  Call For: NURSE  Reason for Call: talk with nurse  Details for Reason: 2-3 DPSOAYS ON PRE AUTH ON FLUOXIN POST SURGERY MED  Summary of call: MOTHER CALLED UPSET AND LEFT [facility] ON MY V MAIL AT 10:14AM- [facility] IS SAYING IT WILL TAKE 2-3 DAYS FOR PRE AUTH ON FLUOXIN AND SHE DOESN'T WANT TO WAIT.    PATIENT HAD SURGERY YESTERDAY.  CALL ON [name]'S CELL [phone].  Initial call taken by: Jeanmarie Plant,  December 17, 2005 10:52 AM      Follow-up for Phone Call   Pt's mother states Floxin drops not covered. Switched to Cortisporin Otic. Pt notified.  Follow-up by: Clelia Schaumann CMA,  December 17, 2005 11:44 AM

## 2005-12-20 NOTE — Unmapped (Signed)
Signed by Shona Needles MD on 12/20/2005 at 00:00:00  ENT      Imported By: Maryellen Pile 12/22/2005 14:45:06    _____________________________________________________________________    External Attachment:    Please see [facility] EMR for this document.

## 2005-12-20 NOTE — Unmapped (Signed)
Signed by Shona Needles MD on 12/20/2005 at 13:37:33    [facility]        HISTORY OF PRESENT ILLNESS   Consult Request from: [name]  Chief Complaint: post op 12-16-05  12/20/2005    VITAL SIGNS:     Weight (lbs.): 126,  Height: 56 inches,  Pulse rate: 84,  Respirations: 16,  BMI: 28.35  BSA: 1.46  Wt chg (lbs): 1    ALLERGIES:      No Known Allergies  MEDICATIONS:    * Intake recorded by: Ilean China LPN  December 20, 2005 1:32 PM      Assessment and Plan   JESSICCA is a 35 Year old Female seen today for evaluation after left Goode t-tube placement and right ossiculoplasty with cartilage graft  Procedure note: otomicroscopic exam shows left PEtube in place, right EAC with gelfoam  RTC one month  Continue dry ear precautions  Daily ear drops right ear   [name],MD

## 2005-12-23 NOTE — Unmapped (Signed)
Signed by Shona Needles MD on 12/23/2005 at 00:00:00  [facility]      Imported By: Coletta Memos 12/29/2005 09:56:38    _____________________________________________________________________    External Attachment:    Please see [facility] EMR for this document.

## 2005-12-24 NOTE — Unmapped (Signed)
Signed by Katrinka Blazing Deters on 12/24/2005 at 11:06:52        [facility]   [address]  [place], [place] [address]   [phone]  Fax: [phone]       December 13, 2005    [name], MD  [address]  [address]  [place], [place] [address]    RE:  GAY MONCIVAIS   DOB:  [birth year]    Assessment and Plan   RAIYAH is a 35 Year old Female seen today for left Eustachian tube dysfunction, right cholesteatoma, bilateral conductive hearing loss  Procedure note: bilateral otomicroscopy performed, right TM atelectatic with minimal mobility, wax impaction in both ears removed with curettes, left PE tube in EAC  Plan: replacement of left PE tube (Goode t-tube), right transcanal / postauricular 2nd look with cartilage graft, ossiculoplasty with prosthesis  Discussed options of hearing aid vs. surgery  Risks discussed: allergic reactions, anesthetic complications, need for hearing aid, deafness, dizziness, tinnitus, taste changes, recurrence of ear disease, extruded prosthesis, no guarantee of success, facial nerve paralysis, as well as other unforeseen problems/ complications.  Despite potential risks and complications, pt and mother desire to proceed.  All questions answered to their satisfaction.  Repeat audio today.    Thank you for allowing me to participate in the care of this pleasant patient.  Please feel free to contact me should you have any concerns or questions.    Sincerely,      Shona Needles, M.D., F.A.C.S.   Assistant Professor   Department of Otolaryngology  Pediatric and Adult Otology/Neurotology

## 2006-01-13 NOTE — Unmapped (Signed)
Signed by Benito Mccreedy Deters on 01/13/2006 at 15:26:49        [facility]   [address]  [place], [place] [address]   [phone]  Fax: [phone]     December 20, 2005    [name], MD  [address]  [address]  [place], [place] [address]    RE:  Nicole Chen   DOB:  1970-12-01    Dear Dr. Valla Leaver:    Just wanted to update you on [facility], who was in to see me today.     Nicole Chen is a 35 Year old Female seen today for evaluation after left Goode t-tube placement and right ossicularly with cartilage graft    Procedure note: otomicroscopic exam shows left PEtube in place, right EAC with gelfoam  RTC one month  Continue dry ear precautions  Daily ear drops right ear    Thank you for allowing me to participate in the care of this pleasant patient.  Please feel free to contact me should you have any concerns or questions.    Sincerely,      Shona Needles, M.D., F.A.C.S.   Assistant Professor   Department of Otolaryngology  Pediatric and Adult Otology/Neurotology

## 2006-02-14 NOTE — Unmapped (Signed)
Signed by Shona Needles MD on 02/14/2006 at 17:51:46    [facility]        HISTORY OF PRESENT ILLNESS   Consult Request from: [name]  Chief Complaint: 1 mo. f/u     VITAL SIGNS:     Weight (lbs.): 125,  Height: 56 inches,  Pulse rate: 72,  Respirations: 14,  BMI: 28.13  BSA: 1.46  Wt chg (lbs): -1    ALLERGIES:      No Known Allergies  MEDICATIONS:    * Intake recorded by: Ilean China LPN  February 14, 2006 11:00 AM    35 yo WF with Down syndrome with hx R cholesteatoma, L eustachian tube dysfunction & bilateral CHL s/p Right tympanoplasty with prosthesis, cartilage graft & L PET placement now 5 weeks post-op.     Pt reports no problems. No otalgia, otorrhea or aural fullness.      Past Medical History:  Eustachian tube dysfunction    PAST SURGICAL HISTORY:    Heart defect repaired in 1972, Down's syndrome       SOCIAL HISTORY:  The patient has Down's syndrome, moderate functioning.  She  is able to live in an apartment upstairs from her mother.  She did complete  [employer] schooling.  She is unable to read, however.  She does work at [employer] doing [employer].  She is a non-smoker.  No use of alcohol.  No use of  recreational drugs.     FAMILY HISTORY:  None.      PHYSICAL EXAM:  GENERAL:Well-developed, well-nourished white Female in no acute distress.ability to converse.  LYMPHATIC:No lymphadenopathy on neck palpation.  PSYCHIATRIC:NL mood and affect.x 4.  NECK:Overall appearance NL. No masses.symmetric with NL tracheal position. No crepitus.masses, tenderness, or enlargement of thyroid on palpation.glands NL size.  HEAD AND FACE:No sinus tenderness.  ZOX:WRUEAV motility NL.erythema or infection.  NOSE:Anterior rhinoscopy performed. Mucosa intact. No masses, polyps, or pus.septal perforation.External nose without infection or abnormality.  OC/OP:Lips, teeth, and gums in good condition.No leukoplakia or masses.palates and tongue of NL symmetry.walls and  tonsillar fossae without abnormalities.  CARDIOVASCULAR:Examination of the carotid arteries upon palpation reveals no increase in pulse amplitude.  SKIN:Visual examination and palpation of the scalp, neck, and head reveals no significant rashes, ulcerations, nodules, or lesions.  EARS:External ears have a normal appearance with no masses, lesions, or scars. Left postauricular incision well-healed.  NEUROLOGIC:Facial nerve function grade I.cranial nerves grossly WNL.    PROCEDURE NOTE: Otomicroscopic exam was done of both EAC's; wax in both eac's; pe tube in left EAC. R TM retracted w/ PORP partially visualized as it appears to be in process of extruding.     ASSESSMENT & PLAN  35 yo WF with Down syndrome s/p R tympanoplasty with PORP & L PET.  Floxin otic drops if otorrhea restarts  Left PE tube in good position  Right prosthesis extruding  RTC 3-4 mos with audiogram at that time      Medical Decision Making:   * review/order other diagnostic or tx interventions  * discussed case with another provider    Patient Education:   * verbal information and/or pamphlet/ literature given to patient.    Risks & Benefits:   * Risks, benefits and treatment options discussed with patient.     Nicole Pion Shockley,MD

## 2006-02-24 NOTE — Unmapped (Signed)
Signed by Katrinka Blazing Deters on 02/24/2006 at 16:30:20        [facility]   [address]  [place], [place] [address]   [phone]  Fax: [phone]       February 14, 2006    [name], MD  [address]  [address]  [place], [place] [address]    RE:  Nicole Chen   DOB:  03-13-1971    Dear Dr. Valla Leaver:    Just wanted to update you on [facility], who was in to see me today.     ASSESSMENT AND PLAN  35 yo WF with Down syndrome s/p R tympanoplasty with PORP & L PET.  Floxin otic drops if otorrhea restarts  Left PE tube in good position  Right prosthesis extruding  RTC 3-4 mos with audiogram at that time    Thank you for allowing me to participate in the care of this pleasant patient.  Please feel free to contact me should you have any concerns or questions.    Sincerely,      Shona Needles, M.D., F.A.C.S.   Assistant Professor   Department of Otolaryngology  Pediatric and Adult Otology/Neurotology

## 2006-08-15 NOTE — Unmapped (Signed)
Signed by Shona Needles MD on 08/15/2006 at 00:00:00  Audiologic Evaluation      Imported By: Coletta Memos 08/18/2006 14:18:00    _____________________________________________________________________    External Attachment:    Please see [facility] EMR for this document.

## 2006-08-15 NOTE — Unmapped (Signed)
Signed by Shona Needles MD on 08/16/2006 at 15:36:55    [facility]        HISTORY OF PRESENT ILLNESS   Consult Request from: [name]  Chief Complaint: cholesteatoma    Medical history questionnaire was reviewed by me with the patient. Review of systems is negative except as noted in HPI.    Medical history questionnaire was reviewed by me with the patient. Review of systems is negative except as noted on scanned document dated 08/15/2006  36yrold WF  Audio done today and reviewed  No otorrhea  Uses hearing aid AS only  No dizziness  No otalgia  H/o cholesteatoma  PAST SURGICAL HISTORY:  Other than above:     1.  Heart defect repaired in 1972, Down's syndrome     PAST MEDICAL HISTORY:  None other than above.     SOCIAL HISTORY:  The patient has Down's syndrome, moderate functioning.  She  is able to live in an apartment upstairs from her mother.  She did complete  [employer] schooling.  She is unable to read, however.  She does work at [employer] doing [employer].  She is a non-smoker.  No use of alcohol.  No use of  recreational drugs.     FAMILY HISTORY:  None.        PHYSICAL EXAM:  GENERAL:Well-developed, well-nourished white Female in no acute distress.was able to converse well with the patient.is able to answer questions adequately and appropriately.\\par LYMPHATIC:No lymphadenopathy on neck palpation.  PSYCHIATRIC:NL mood and affect.x 4.  NECK:Overall appearance NL. No masses.symmetric with NL tracheal position. No crepitus.masses, tenderness, or enlargement of thyroid on palpation.glands NL size.  HEAD AND FACE:No sinus tenderness.  MVH:QIONGE motility NL.erythema or infection.  NOSE:Anterior rhinoscopy performed. Mucosa intact. No masses, polyps, or pus.septal perforation.External nose without infection or abnormality.  OC/OP:Lips, teeth, and gums in good condition.No leukoplakia or masses.palates and tongue of NL symmetry.walls and tonsillar fossae without  abnormalities.  CARDIOVASCULAR:Examination of the carotid arteries upon palpation reveals no increase in pulse amplitude.  SKIN:Visual examination and palpation of the scalp, neck, and head reveals no significant rashes, ulcerations, nodules, or lesions.  EARS:External ears have a normal appearance with no masses, lesions, or scars.  NEUROLOGIC:Facial nerve function grade I.cranial nerves grossly WNL.    PROCEDURE NOTE: Otomicroscopic exam was done of both EAC's; bilateral wax impaction, R>L (unable to remove it all on right)- removed with suction and forceps; left PE tube in place but beginning to extrude    ASSESSMENT & PLAN:  Pt with h/o cholesteatoma s/p surgery  Audio done; will discuss at f/u visit using bilateral hearing aids  Use hydrogen peroxide weekly to left ear to allow wax to come out easier  Use hydrogen peroxide 2x/week right ear  Discussed possibility of mild pain / discomfort with peroxide  RTC 2 months for recheck of ears    VITAL SIGNS:    Height: 56 inches,  Pulse rate: 76,  Respirations: 15,    ALLERGIES:      No Known Allergies  MEDICATIONS:    * Intake recorded by: Ilean China LPN  August 15, 2006 5:08 PM      Medical Decision Making:   * review/order other diagnostic or tx interventions  * discussed case with another provider  * Hearing Test Reviewed: Yes    Patient Education:   * verbal information and/or pamphlet/ literature given to patient.    Risks &  Benefits:   * Risks, benefits and treatment options discussed with patient.     [name],MD

## 2006-08-19 NOTE — Unmapped (Signed)
Signed by Katrinka Blazing Deters on 08/19/2006 at 16:10:28        [facility]   [address]  [place], [place] [address]   [phone]  Fax: [phone]       August 15, 2006    [name], MD  [address]  [address]  [place], [place] [address]    RE:  Nicole Chen   DOB:  May 29, 1970    Dear Dr. Valla Leaver:    Just wanted to update you on Nicole Chen, who was in to see me today.     ASSESSMENT AND PLAN    Pt with h/o cholesteatoma s/p surgery  Audio done; will discuss at f/u visit using bilateral hearing aids  Use hydrogen peroxide weekly to left ear to allow wax to come out easier  Use hydrogen peroxide 2x/week right ear  Discussed possibility of mild pain / discomfort with peroxide  RTC 2 months for recheck of ears    Thank you for allowing me to participate in the care of this pleasant patient.  Please feel free to contact me should you have any concerns or questions.    Sincerely,      Shona Needles, M.D., F.A.C.S.   Assistant Professor   Department of Otolaryngology  Pediatric and Adult Otology/Neurotology

## 2007-10-30 NOTE — Unmapped (Signed)
Signed by Shona Needles MD on 10/30/2007 at 14:20:57    [facility]        HISTORY OF PRESENT ILLNESS   Consult Request from: [name]  Chief Complaint: hearing loss    Medical history questionnaire was reviewed by me with the patient. Review of systems is negative except as noted in HPI.    Medical history questionnaire was reviewed by me with the patient. Review of systems is negative except as noted on scanned document dated 10/30/2007  37 yo WF with history of right cholesteatoma removal.  Pt's current left hearing aid is out being worked on.  Pt has been using hydrogen peroxide bilaterally as best as they can.  No otorrhea, otalgia, or aural fullness.  No dizziness.     SURGICAL HISTORY: heart defect repair in 1972, left cholesteatoma removal  PAST MEDICAL HISTORY:  down syndrome  SOCIAL HISTORY:  The patient has Down's syndrome, moderate functioning.  She  is able to live in an apartment upstairs from her mother.  She did complete  MRDD schooling.  She is unable to read, however.  She does work at [employer] doing [employer].  She is a non-smoker.  No use of alcohol.  No use of  recreational drugs.  FAMILY HISTORY:  None.    PHYSICAL EXAM:  GENERAL:Well-developed, well-nourished white Female in no acute distress.was able to converse well with the patient.patient is able to answer questions adequately and appropriately.\\par LYMPHATIC:No lymphadenopathy on neck palpation.  PSYCHIATRIC:NL mood and affect.x 4.  NECK:Overall appearance NL. No masses.symmetric with NL tracheal position. No crepitus.masses, tenderness, or enlargement of thyroid on palpation.glands NL size.  HEAD AND FACE:No sinus tenderness.  WUJ:WJXBJY motility NL.erythema or infection.  NOSE:Anterior rhinoscopy performed. Mucosa intact. No masses, polyps, or pus.septal perforation.External nose without infection or abnormality.  OC/OP:Lips, teeth, and gums in good condition.No leukoplakia or  masses.palates and tongue of NL symmetry.walls and tonsillar fossae without abnormalities.  CARDIOVASCULAR:Examination of the carotid arteries upon palpation reveals no increase in pulse amplitude.  SKIN:Visual examination and palpation of the scalp, neck, and head reveals no significant rashes, ulcerations, nodules, or lesions.  EARS:External ears have a normal appearance with no masses, lesions, or scars.fork tests not reliable.  NEUROLOGIC:Facial nerve function grade I.cranial nerves grossly WNL.  Gait nl.     PROCEDURE NOTE: Otomicroscopic exam was done of both EAC's; right with large amounts of cerumen present, prosthesis extruded.  Mastoid powder sprayed since TM moist.  Left TM retracted but mobile with negative pressure    ASSESSMENT & PLAN:  37 yo WF with history of right cholesteatoma-NED  Right cerumen impaction. Removed today.  Dry ear precautions for right ear.  Hearing aid evaluation  ETD. Pt given prescription for Ear Popper trial  RTC 6 months with audio        VITAL SIGNS:    Height: 56 inches,  Pulse rate: 80,  Respirations: 14,    ALLERGIES:      No Known Allergies  MEDICATIONS:    * Intake recorded by: Ilean China LPN  October 30, 7827 10:07 AM      Medical Decision Making:   * review/order other diagnostic or tx interventions  * discussed case with another provider    Patient Education:   * verbal information and/or pamphlet/ literature given to patient.    Risks & Benefits:   * Risks, benefits and treatment options discussed with patient.  Plan          PRECEPTOR ACKNOWLEDGEMENTS    I saw and examined the patient.  I discussed with resident and agree with resident's findings and plan as documented in resident's note.    Comments: Seen with Kallie Edward PA-C.      [name],MD

## 2007-10-30 NOTE — Unmapped (Signed)
Signed by Shona Needles MD on 10/30/2007 at 00:00:00  [facility]      Imported By: Coletta Memos 11/01/2007 14:21:34    _____________________________________________________________________    External Attachment:    Please see [facility] EMR for this document.

## 2007-11-01 NOTE — Unmapped (Signed)
Signed by Lannie Fields on 11/01/2007 at 13:18:24        [facility]   [address]  [place], [place] [address]   [phone]  Fax: [phone]           October 30, 2007    [name], MD  [address]  [address]  [place], [place] [address]      RE:  Nicole Chen   DOB:  July 27, 1970      Dear Dr. [name]:    Just wanted to update you on [facility], who was in to see me today.     ASSESSMENT AND PLAN    37 yo WF with history of right cholesteatoma-NED  Right cerumen impaction. Removed today.  Dry ear precautions for right ear.  Hearing aid evaluation  ETD. Pt given prescription for Ear Popper trial  RTC 6 months with audio    Thank you for allowing me to participate in the care of this pleasant patient.  Please feel free to contact me should you have any concerns or questions.    Sincerely,      Shona Needles, M.D., F.A.C.S.   Assistant Professor   Department of Otolaryngology  Pediatric and Adult Otology/Neurotology

## 2008-06-10 NOTE — Unmapped (Addendum)
Signed by Shona Needles MD on 06/12/2008 at 12:18:51    Appointment status changed to canceled by  Ashley Royalty on 06/10/2008 10:25 AM.    Cancellation Comments  ---------------------    Bad Weather    Appointment Information  -----------------------    Appt Type:  RET-OTO       Date:  Monday, June 10, 2008       Time:  3:30 PM for 15 min    Urgency:  Routine    Made By:  Fay Records   To Visit:  Shona Needles MD        ERN:  [id number]     Reason:  (MOF) FOLLOW UP    Appt Comments  -------------  -- 04/29/08 9:10: Elesa Massed) BOOKED --  Routine  at 06/10/2008 3:30 PM for 15 min  (MOF) FOLLOW UP  1:00 AUD/1:30 HAE  6 MO FU CHOL.  Signed by Shona Needles MD on 06/12/2008 at 12:18:51    Pt missed appointment this week.  Will have Tania Tobar call pt and reschedule for some time within the next month.

## 2009-07-14 NOTE — Unmapped (Signed)
Signed by Shona Needles MD on 07/14/2009 at 00:00:00  Disclosure to Family/Friends      Imported By: Betsey Amen 07/21/2009 18:07:58    _____________________________________________________________________    External Attachment:    Please see [facility] EMR for this document.

## 2009-07-14 NOTE — Unmapped (Signed)
Signed by Shona Needles MD on 07/14/2009 at 00:00:00  ENT Medical History      Imported By: Betsey Amen 07/21/2009 21:03:15    _____________________________________________________________________    External Attachment:    Please see [facility] EMR for this document.

## 2009-07-14 NOTE — Unmapped (Signed)
Signed by Shona Needles MD on 07/14/2009 at 00:00:00  Audiologic Evaluation      Imported By: Betsey Amen 07/21/2009 18:12:18    _____________________________________________________________________    External Attachment:    Please see [facility] EMR for this document.

## 2009-07-14 NOTE — Unmapped (Signed)
Signed by Diona Foley on 07/15/2009 at 10:48:30                         [facility]         Otolaryngology         [address]         [place], [place] [address]         p [phone] f [phone]         [email or web address]      July 14, 2009        [name], M.D.   [address]  [place],  [place]  [address]    RE:  CADEN FATICA  DOB:  November 03, 1970    Dear Dr. Valla Leaver,    Just wanted to update you on [facility], who was in to see me today.     ASSESSMENT AND PLAN    Patient is a 39 year-old white female.  H/o cholesteatoma, Eustachian tube dysfunction.  NED of cholesteatoma.  Continue left hearing aid.  Right hearing aid in future.  Right mixed hearing loss, left SNHL.  RTC 2-3 years with audio.  Dry ear precautions to right ear.    Thank you for allowing me to participate in the care of this pleasant patient.  Please feel free to contact me should you have any concerns or questions.    Sincerely,      Shona Needles, M.D., F.A.C.S.   Assistant Professor   Department of Otolaryngology  Pediatric and Adult Otology/Neurotology

## 2009-07-14 NOTE — Unmapped (Signed)
Signed by Shona Needles MD on 07/14/2009 at 16:26:07    [facility]        HISTORY OF PRESENT ILLNESS   Consult Request from: [name]  Chief Complaint: Hearing loss    Medical history questionnaire was reviewed by me with the patient. Review of systems is negative except as noted in HPI.    Medical history questionnaire was reviewed by me with the patient. Review of systems is negative except as noted on scanned document dated 07/14/2009  Hearing loss.  No otalgia, no dizziness, no c/o tinnitus.    SURGICAL HISTORY: heart defect repair in 1972, left cholesteatoma removal  PAST MEDICAL HISTORY:  down syndrome  SOCIAL HISTORY:  The patient has Down's syndrome, moderate functioning.  She  is able to live in an apartment upstairs from her mother.  She did complete  [employer] schooling.  She is unable to read, however.  She does work at [employer] doing [employer].  She is a non-smoker.  No use of alcohol.  No use of  recreational drugs.  FAMILY HISTORY:  None.      PHYSICAL EXAM:  GENERAL:Well-developed, well-nourished White Female in no acute distress.was able to converse well with the patient.patient is able to answer questions adequately and appropriately.\\par LYMPHATIC:No lymphadenopathy on neck palpation.  PSYCHIATRIC:NL mood and affect.x 4.  NECK:Overall appearance NL. No masses.symmetric with NL tracheal position. No crepitus.masses, tenderness, or enlargement of thyroid on palpation.glands NL size.  HEAD AND FACE:No sinus tenderness.  WNU:UVOZDG motility NL.erythema or infection.  NOSE:Anterior rhinoscopy performed. Mucosa intact. No masses, polyps, or pus.septal perforation.External nose without infection or abnormality.  OC/OP:Lips, teeth, and gums in good condition.No leukoplakia or masses.palates and tongue of NL symmetry.walls and tonsillar fossae without abnormalities.  CARDIOVASCULAR:Examination of the carotid arteries upon palpation reveals no increase in  pulse amplitude.  SKIN:Visual examination and palpation of the scalp, neck, and head reveals no significant rashes, ulcerations, nodules, or lesions.  EARS:External ears have a normal appearance with no masses, lesions, or scars.\\parNEUROLOGIC:Facial nerve function grade I.cranial nerves grossly WNL.    PROCEDURE NOTE: Otomicroscopic exam was done of both EAC's; right TM with cartilage, prosthesis; mastoid powder sprayed.  Left TM atelectatic but mobile, safe  Bilateral wax impaction removed with suction and forceps    ASSESSMENT & PLAN:  Patient is a 39 year old white female  H/o cholesteatoma, Eustachian tube dysfunction  NED of cholesteatoma  Continue left hearing aid  Right hearing aid in future  Right mixed hearing loss, left SNHL  RTC 2-3 years with audio  Dry ear precautions to right ear      VITAL SIGNS:     Weight (lbs.): 122,  Height: 56 inches,  Respirations: 20,  BMI: 27.45  BSA: 1.44  Wt chg (lbs): -3    ALLERGIES:      No Known Allergies  MEDICATIONS:    * Intake recorded by: Ceasar Mons MA  July 14, 2009 4:18 PM        Medical Decision Making:   * review/order other diagnostic or tx interventions  * discussed case with another provider  * Hearing Test Reviewed: yes    Patient Education:   * verbal information and/or pamphlet/ literature given to patient.    Risks & Benefits:   * Risks, benefits and treatment options discussed with patient.    Plan          PRECEPTOR ACKNOWLEDGEMENTS    I  saw and examined the patient.  I discussed with resident and agree with resident's findings and plan as documented in resident's note.     [name],MD

## 2010-04-21 NOTE — Unmapped (Signed)
Signed by Shona Needles MD on 04/21/2010 at 11:08:45    [facility]        HISTORY OF PRESENT ILLNESS    Consult Request from: [name]  Chief Complaint: hearing loss    Medical history questionnaire was reviewed by me with the patient. Review of systems is negative except as noted on scanned document dated 04/21/2010    Hearing loss using HA hx of Down's syndrome.  No otalgia, no dizziness, no c/o tinnitus.  s/p right cholesteatoma excision in 2007  Recently her left hearing went down   Has hearing loss.    SURGICAL HISTORY: heart defect repair in 1972, left cholesteatoma removal  PAST MEDICAL HISTORY:  down syndrome  SOCIAL HISTORY:  The patient has Down's syndrome, moderate functioning.  She  is able to live in an apartment upstairs from her mother.  She did complete  [employer] schooling.  She is unable to read, however.  She does work at [employer] doing [employer].  She is a non-smoker.  No use of alcohol.  No use of  recreational drugs.  FAMILY HISTORY:  None.      PHYSICAL EXAM:  GENERAL:Well-developed, well-nourished White Female in no acute distress.was able to converse well with the patient.patient is able to answer questions adequately and appropriately.\\par LYMPHATIC:No lymphadenopathy on neck palpation.  PSYCHIATRIC:NL mood and affect.x 4.  NECK:Overall appearance NL. No masses.symmetric with NL tracheal position. No crepitus.masses, tenderness, or enlargement of thyroid on palpation.glands NL size.  HEAD AND FACE:No sinus tenderness.  ZOX:WRUEAV motility NL.erythema or infection.  NOSE:Anterior rhinoscopy performed. Mucosa intact. No masses, polyps, or pus.septal perforation.External nose without infection or abnormality.  OC/OP:Lips, teeth, and gums in good condition.No leukoplakia or masses.palates and tongue of NL symmetry.walls and tonsillar fossae without abnormalities.  CARDIOVASCULAR:Examination of the carotid arteries upon palpation reveals no  increase in pulse amplitude.  SKIN:Visual examination and palpation of the scalp, neck, and head reveals no significant rashes, ulcerations, nodules, or lesions.  EARS:External ears have a normal appearance with no masses, lesions, or scars.\\parNEUROLOGIC:Facial nerve function grade I.cranial nerves grossly WNL.    PROCEDURE NOTE: Otomicroscopic exam was done of both EAC's; right TM with cartilage, TM with some wetness, mastoid powder applied.  Left TM atelectatic, retraction. with some calcification/myringosclerosis; no cholesteatoma.    ASSESSMENT & PLAN:  Patient is a 39 year old white female  Rt H/o cholesteatoma, Eustachian tube dysfunction  Bilateral conductive hearing loss.  Needs hearing aids AU  [facility].  - dry ear precaution  - RTC in 1 yr with audio.    [facility]  VITAL SIGNS:     Weight (lbs.): 125,  Height: 56 inches,  Respirations: 14,    BMI: 28.13  BSA: 1.46  Wt chg (lbs): 3    ALLERGIES:      No Known Allergies  MEDICATIONS:   2 * MULTI VITAMINS     * Intake recorded by: Ilean China LPN  April 21, 2010 10:06 AM    Medications reviewed, updated and verified with patient or patient representative.    Screening for unhealthy alcohol use performed.  Women (Any Age) or Men (over Age 57): nondrinker    Depression Screening:     During the past month,   Have you often been bothered by feeling down, depressed or hopeless? No  Have you often been bothered by little interest or pleasure in doing things?  No      Medical Decision Making:   * review/order other diagnostic or tx interventions  * discussed case with another provider  * Hearing Test Reviewed: yes    Patient Education:   * verbal information and/or pamphlet/ literature given to patient.    Risks & Benefits:   * Risks, benefits and treatment options discussed with patient.    Plan          PRECEPTOR ACKNOWLEDGEMENTS    I saw and examined the patient.  I discussed with resident and  agree with resident's findings and plan as documented in resident's note.     [name],MD

## 2010-04-21 NOTE — Unmapped (Signed)
Signed by Shona Needles MD on 04/21/2010 at 00:00:00  Audiologic Evaluation      Imported By: Coletta Memos 04/29/2010 15:20:48    _____________________________________________________________________    External Attachment:    Please see [facility] EMR for this document.

## 2010-04-21 NOTE — Unmapped (Signed)
Signed by Shona Needles MD on 04/21/2010 at 00:00:00  ENT Registration Update      Imported By: Coletta Memos 04/29/2010 22:12:11    _____________________________________________________________________    External Attachment:    Please see [facility] EMR for this document.

## 2010-04-21 NOTE — Unmapped (Signed)
Signed by Diona Foley on 04/21/2010 at 11:16:01                         [facility]         Otolaryngology         [address]         [place], [place] [address]         p [phone] f [phone]         [email or web address]      April 21, 2010      [name], M.D.   [address]  [place],  [place]  [address]    RE:  Nicole Chen  DOB:  09-Mar-1971    Dear Dr. Valla Leaver,    Just wanted to update you on [facility], who was in to see me today.     ASSESSMENT AND PLAN    Patient is a 39 year old white female.  Rt H/o cholesteatoma, Eustachian tube dysfunction.  Bilateral conductive hearing loss.  Needs hearing aids AU.  Recommend [facility].  - dry ear precaution.  - RTC in 1 yr with audio.    Thank you for allowing me to participate in the care of this pleasant patient.  Please feel free to contact me should you have any concerns or questions.    Sincerely,      Shona Needles, M.D., F.A.C.S.   Assistant Professor   Department of Otolaryngology  Pediatric and Adult Otology/Neurotology

## 2010-05-07 NOTE — Unmapped (Signed)
Signed by Shona Needles MD on 05/07/2010 at 00:00:00  Audiology Referral      Imported By: Coletta Memos 05/24/2010 23:46:39    _____________________________________________________________________    External Attachment:    Please see [facility] EMR for this document.

## 2010-06-03 NOTE — Unmapped (Signed)
Signed by Shona Needles MD on 06/03/2010 at 00:00:00  Referral      Imported By: Coletta Memos 06/13/2010 11:54:35    _____________________________________________________________________    External Attachment:    Please see [facility] EMR for this document.

## 2011-03-22 ENCOUNTER — Ambulatory Visit: Admit: 2011-03-22 | Discharge: 2011-03-22 | Payer: MEDICARE | Attending: Audiologist

## 2011-03-22 ENCOUNTER — Ambulatory Visit: Admit: 2011-03-22 | Discharge: 2011-03-22 | Payer: MEDICARE | Attending: Otology & Neurotology

## 2011-03-22 DIAGNOSIS — H908 Mixed conductive and sensorineural hearing loss, unspecified: Secondary | ICD-10-CM

## 2011-03-22 DIAGNOSIS — H902 Conductive hearing loss, unspecified: Secondary | ICD-10-CM

## 2011-03-22 NOTE — Unmapped (Signed)
Medical history questionnaire was reviewed by me with the patient. Review of systems is negative except as noted in HPI.  Medical history questionnaire was reviewed by me with the patient. Review of systems is negative except as noted on scanned document dated from today:  Cc:  Chief Complaint   Patient presents with   ??? Follow-up      Nicole Chen is a 40 y.o.  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      PAST MEDICAL HISTORY:   Past Medical History   Diagnosis Date   ??? Abnormal finding      ent problems   ??? Wears glasses    ??? Hearing impairment    ??? Cholesteatoma    ??? Down's syndrome         SURGICAL HISTORY:   No past surgical history on file.   Prior history of ear surgery / skull base surgery / head and neck surgery - right canal wall reconstruction 2006 and 2nd look tympanoplasty 2007    FAMILY HISTORY:  Family History   Problem Relation Age of Onset   ??? Hearing loss Other      grandmother   ??? Cancer Other    ??? Other Cousin      brain injury      History of cancers, ear disease, brain tumors - none    SOCIAL HISTORY:  History     Social History   ??? Marital Status: Single     Spouse Name: N/A     Number of Children: N/A   ??? Years of Education: N/A     Occupational History   ??? Not on file.     Social History Main Topics   ??? Smoking status: Never Smoker    ??? Smokeless tobacco: Not on file    Comment: 05-03-2005   ??? Alcohol Use: No      05-03-2005   ??? Drug Use: No      05-03-2005   ??? Sexually Active:      Other Topics Concern   ??? Not on file     Social History Narrative   ??? No narrative on file      History if illegal drug use, HIV risk factors, blood transfusions - none   History of tobacco or alcohol use - no smoke, no EtOH    MEDICATIONS:  Outpatient Encounter Prescriptions as of  03/22/2011   Medication Sig Dispense Refill   ??? multivitamin (MULTIPLE VITAMINS) tablet               ALLERGIES:  No Known Allergies        PHYSICAL EXAM:  Blood pressure 116/84, pulse 72, resp. rate 20, height 4' 8 (1.422 m), weight 122 lb (55.339 kg).    GENERAL:  Well-developed, well-nourished person in no acute distress. I was able to converse well with the patient. The patient is able to answer questions adequately and appropriately.   LYMPHATIC:  No lymphadenopathy on neck palpation.  PSYCHIATRIC:  NL mood and affect. A+O x 4.  NECK:  Overall appearance NL. No masses. Neck symmetric with NL tracheal position. No crepitus. No masses, tenderness, or enlargement of thyroid on palpation. Salivary glands NL size.  HEAD AND FACE:  No sinus tenderness.  EYE:  Ocular motility NL. No erythema or infection.  NOSE:  Anterior rhinoscopy performed. Mucosa intact. No masses, polyps, or pus. No septal perforation.  External nose without infection or abnormality.  OC/OP:  Lips, teeth, and gums in good condition. Mucosa:  No leukoplakia or masses. Hard/soft palates and tongue of NL symmetry. Pharyngeal walls and tonsillar fossae without abnormalities.  CARDIOVASCULAR:  Examination of the carotid arteries upon palpation reveals no increase in pulse amplitude.  SKIN:  Visual examination and palpation of the scalp, neck, and head reveals no significant rashes, ulcerations, nodules, or lesions.  EARS:  External ears have a normal appearance with no masses, lesions, or scars. Tuning fork tests performed at 512 Hz. Weber left. Rinne was positive bilaterally.   NEUROLOGIC:  Facial nerve function grade I. Other cranial nerves grossly WNL.   PROCEDURE NOTE: Otomicroscopic exam was done of both EAC's; other than wax, EAC's normal and TM's nl,  - fistula test (evidence of prior ear surgery; hypomobile)       ASSESSMENT & PLAN:  Dear Dr. Si Gaul,  I had the pleasure of seeing your patient in consultation.  Barring hearing from  you otherwise, I will proceed with the recommendations listed below.   40yo F h/o cholesteatoma, s/p mastoidectomy, with bilateral hearing aids for CHL.  - Continue dry ear precautions  - Will need Neurology and PCP outpatient followup - information given to patient (due to h/o stroke)  - Will RTC in 1-62yrs with audio.  Continue hearing aids for CHL.  NED for cholesteatoma.       Medical Decision Making:   * review/order other diagnostic or tx interventions  * discussed case with another provider (letter sent)  * Hearing Test Reviewed: yes    Patient Education:   * verbal information and/or pamphlet/ literature given to patient.    Risks & Benefits:   * Risks, benefits and treatment options discussed with patient.    PRECEPTOR ACKNOWLEDGEMENTS    I saw and examined the patient.  I discussed with resident and agree with resident's findings and plan as documented in resident's note.

## 2011-03-22 NOTE — Unmapped (Signed)
Addended by: Moses Manners on: 03/22/2011 03:12 PM     Modules accepted: Orders, Level of Service

## 2011-03-22 NOTE — Unmapped (Signed)
See scanned audiogram.

## 2011-05-06 ENCOUNTER — Ambulatory Visit: Admit: 2011-05-06 | Discharge: 2011-05-06 | Payer: MEDICARE

## 2011-05-06 ENCOUNTER — Inpatient Hospital Stay: Admit: 2011-05-06 | Payer: MEDICARE

## 2011-05-06 DIAGNOSIS — E785 Hyperlipidemia, unspecified: Secondary | ICD-10-CM

## 2011-05-06 DIAGNOSIS — M79609 Pain in unspecified limb: Secondary | ICD-10-CM

## 2011-05-06 MED ORDER — pravastatin (PRAVACHOL) 10 MG tablet
10 | ORAL_TABLET | Freq: Every day | ORAL | 1.00 refills | 30.00000 days | Status: AC
Start: 2011-05-06 — End: 2011-10-14

## 2011-05-06 MED ORDER — levETIRAcetam (KEPPRA) 100 mg/mL solution
100 | Freq: Two times a day (BID) | ORAL | Status: AC
Start: 2011-05-06 — End: 2011-12-02

## 2011-05-06 MED ORDER — clotrimazole (LOTRIMIN) 1 % cream
1 | Freq: Two times a day (BID) | TOPICAL | 0.00 refills | 7.00000 days | Status: AC
Start: 2011-05-06 — End: 2011-12-02

## 2011-05-06 NOTE — Unmapped (Signed)
Since pt has been out of statin x 1 month, will restart and then recheck labs in 4-6 weeks to make sure we are at goal.

## 2011-05-06 NOTE — Unmapped (Signed)
Addended by: Si Gaul on: 05/06/2011 01:06 PM     Modules accepted: Level of Service

## 2011-05-06 NOTE — Unmapped (Signed)
Records from [facility] reveal normal TSH, normal hgA1c, normal CBC. Doing well socially and with good support from mom. No interventions at this time.

## 2011-05-06 NOTE — Unmapped (Addendum)
Subjective  HPI:   Patient ID: Nicole Chen is a 41 y.o. female.    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 Was found to have a L MCA stroke. Had w/u there that was unrevealing of an etiology. Per record review, had neg carotid dopplers, ECHO with no obvious clot. Lab studies were unrevealing including normal lipids, HgA1c and homocysteine. Had seizure shortly after stroke on the R side. Had EEG with focal epileptiform activity in the area of infarct. SHe has been on keppra 250 mg BID and has been seizure-free since that time. Has regained all her mobility. Has been feeling a little tired since d/c, but mother thinks this may be 2/2 keppra.     Down's syndrome: Lives with mom. Does all ADLs, not iADLs. Works on an Theatre stage manager. Does ballroom dancing and bowling with some other people with Down's syndrome. Had h/o VSD repair and has no problems since. Had CBC while hospitalized with some macrocytosis, but otherwise normal. Does occasionally snore, but doesn't stop breathing per mom.     Foot pain: Has been complaining of L foot pain for a few months. No known trauma. Does report bump on the foot. Walking ok, just a little uncomfortable. Has some itching of the L foot as well.     HLD: Ran out of pravachol one month. LDL of 79 prior to therapy in the hospital.     HM: Td in 2005. Fluvax UTD. UTD with labs. Never been sexually active.       I reviewed records from [facility].      ROS:   Review of Systems   Constitutional: Negative for appetite change and unexpected weight change.   HENT: Positive for hearing loss.    Respiratory: Negative for cough and shortness of breath.    Cardiovascular: Negative for chest pain and leg swelling.   Gastrointestinal: Negative for abdominal pain, diarrhea,  constipation and blood in stool.   Genitourinary: Negative for hematuria and difficulty urinating.   Musculoskeletal: Positive for arthralgias. Negative for myalgias.   Skin: Negative for rash and wound.   Neurological: Negative for seizures, syncope and headaches.   Psychiatric/Behavioral: Negative for disturbed wake/sleep cycle and dysphoric mood.          Objective:   Physical Exam   Constitutional: She is oriented to person, place, and time. She appears well-developed and well-nourished. No distress.   HENT:   Head: Normocephalic and atraumatic.   Mouth/Throat: Oropharynx is clear and moist. No oropharyngeal exudate.   Eyes: Conjunctivae are normal. No scleral icterus.   Neck: Normal range of motion. Neck supple. No JVD present. No thyromegaly present.   Cardiovascular: Normal rate, regular rhythm and normal heart sounds.    Pulmonary/Chest: Effort normal and breath sounds normal.   Abdominal: Soft. Bowel sounds are normal. There is no tenderness.   Musculoskeletal: Normal range of motion. She exhibits no edema.   Lymphadenopathy:     She has no cervical adenopathy.   Neurological: She is alert and oriented to person, place, and time.   Skin: Skin is warm and dry. She is not diaphoretic.   Psychiatric: She has a normal mood and affect. Her behavior is normal.  Filed Vitals:    05/06/11 0841   BP: 100/62   Pulse: 60   Height: 4' 8 (1.422 m)   Weight: 123 lb (55.792 kg)     Body mass index is 27.58 kg/(m^2).  Body surface area is 1.48 meters squared.                Assessment/Plan:     CVA (cerebral vascular accident)  Unclear etiology. Will obtain records from [facility]. Seeing Dr. Mary Sella in Feb. Continue ASA and pravastatin. Given h/o VSD repair as a baby and recent stroke, possible anticoagulation will need to be discussed. Of note, pt is petrified of blood draws and this would likely be a very challenging endeavor.     HLD (hyperlipidemia)  Since pt has been out of statin x 1 month,  will restart and then recheck labs in 4-6 weeks to make sure we are at goal.     Seizure  Likely 2/2 concurrent stroke. Did have EEG with focal epileptiform discharges in the stroke territory. No further sx. Continue keppra for now and defer management and possible titration to Dr. Mary Sella.     Tinea pedis of left foot  Tx with lotrimin.     Foot pain  Has visible deformity on exam. Check x-ray to further elucidate.     Down's syndrome  Records from [facility] reveal normal TSH, normal hgA1c, normal CBC. Doing well socially and with good support from mom. No interventions at this time.     Routine health maintenance  UTD with vaccinations. No pap indicated as she has never been sexually active and this would be traumatic for pt. Advised continued exercise.

## 2011-05-06 NOTE — Unmapped (Signed)
UTD with vaccinations. No pap indicated as she has never been sexually active and this would be traumatic for pt. Advised continued exercise.

## 2011-05-06 NOTE — Unmapped (Signed)
Unclear etiology. Will obtain records from [facility]. Seeing Dr. Mary Sella in Feb. Continue ASA and pravastatin. Given h/o VSD repair as a baby and recent stroke, possible anticoagulation will need to be discussed. Of note, pt is petrified of blood draws and this would likely be a very challenging endeavor.

## 2011-05-06 NOTE — Unmapped (Signed)
Likely 2/2 concurrent stroke. Did have EEG with focal epileptiform discharges in the stroke territory. No further sx. Continue keppra for now and defer management and possible titration to Dr. Mary Sella.

## 2011-05-06 NOTE — Unmapped (Signed)
Obtain x-ray on the 2nd floor today. I will call you with the results.     Use lotrimin on the L foot twice daily to help with itching.    I will call in 1 month to discuss what labs are needed, including cholesterol.     Athlete's Foot  Athlete's foot (Tinea Pedis) is an infection of the skin on the feet. It often occurs on the skin between or underneath the toes, but also on the soles of the feet. Athlete's foot is more likely to occur in hot, humid weather. It may occur in anyone, but not washing feet or changing socks frequently enough contributes to this. It may be spread to other people by sharing towels or shower stalls.  CAUSE  Athlete???s foot is caused by a fungus (not a worm, despite the nickname ringworm). This is a very small (microscopic) germ that thrives in warm, moist environments such as sweaty feet and other parts of the body. The fungus usually spreads from one person to another. This happens from sharing shower stalls, sharing towels, wet floors, etc. People with weakened immune systems, including people with diabetes, may be more prone to athlete???s foot.  SYMPTOMS  ?? Itchy areas between the toes or on the soles of your feet.   ?? White, flakey, or scaly areas between the toes or on the soles of the feet.   ?? Tiny, intensely itchy blisters between the toes or on the soles of the feet.   ?? The skin can develop tiny cuts. These cuts can develop further into an infection from bacteria (another kind of germ).   ?? The adjacent toenails may be thickened or discolored.   Athlete's foot does not usually cause fever or generalized illness.  DIAGNOSIS  Your caregiver usually will recognize athlete???s foot based on appearance. If the diagnosis is uncertain, then the rash area can be scraped. Then, the small particles of the skin can be examined more closely. This could include your caregiver examining the specimen under a microscope. It might involve sending the specimen to a lab to see if the fungus can be  grown. This could take several weeks to get a result.  TREATMENT  The first step in treatment is prevention:  ?? Do not share towels.   ?? Wear sandals or other foot protection in wet areas such as locker rooms, shared showers and public swimming pools.   ?? Keep your feet dry. Wear shoes that allow air to circulate and use cotton or wool socks.   Many products are available to treat athlete???s foot. Most are medications that kill fungus. They are available as powders or creams. Some require a prescription, some do not. Your caregiver or pharmacist can make a suggestion. Do not use steroid creams on athlete???s foot.  HOME CARE INSTRUCTIONS  ?? Use medications as prescribed.   ?? Keep your feet clean and cool with daily washes. Dry them well, especially between your toes.   ?? Change your socks every day. Use cotton or wool socks. It is helpful to go barefoot. If you go barefoot, even in the household, be careful not to cause other injuries. (Stepping on toothpicks and needles can require Emergency Department visits!) In hot climates, it may be necessary to change socks 2 to 3 times per day. Wear sandals or canvas tennis shoes during treatment.   ?? If you have blisters, soak your feet in Burrow's or an Epsom salt solution for 20 to 30 minutes, 2 times  a day to dry out the blisters. Be sure to thoroughly pat dry, or air dry, your feet.   ?? To keep infection from returning, keep wearing cotton or wool socks and dry your feet well after washing. Wipe between your toes at bedtime.   ?? Fungal infections are hard to get rid of and respond slowly. Continue the treatment even if your athlete's foot does not seem to be getting better. Treatment may be needed for several weeks.   SEEK MEDICAL CARE IF:  ?? You develop a temperature with no other apparent cause.   ?? You develop swelling and soreness & redness (inflammation) in your foot.   ?? The infection is spreading. Your foot becomes swollen, hot and red (inflamed).   ?? Treatment is  not helping.   ?? Athlete's foot is not better in 7 days or completely cured in 30 days.   ?? You have any problems that may be related to the medicine you are taking.   MAKE SURE YOU:   ?? Understand these instructions.   ?? Will watch your condition.   ?? Will get help right away if you are not doing well or get worse.   Document Released: 04/09/2000 Document Re-Released: 03/25/2008  ExitCare?? Patient Information ??2012 [place], [place].

## 2011-05-06 NOTE — Unmapped (Signed)
Tx with lotrimin.

## 2011-05-06 NOTE — Unmapped (Signed)
Has visible deformity on exam. Check x-ray to further elucidate.

## 2011-05-07 NOTE — Unmapped (Signed)
Spoke with Pamela's mother and let her know that she does have some deformities of L foot and would advise her to see podiatry for possible orthotics, etc.     Ryann, could you call our podiatry group and see if they take medicare/medicaid? If they don't, do they have any suggestions? Once you find this out, could you call Dorene's mother back (get her name BTW as we don't have her listed on the contacts and she is pt's POA and caregiver) and give her this info. She will be at work today, but you could leave a message or call her after 4:30 PM. Thanks!

## 2011-05-07 NOTE — Unmapped (Signed)
Spoke with Renea Ee (patients mother/POA) informed her that I scheduled her an apt with Dr. Antony Salmon on Feb the 4th @ 215 pm/ mother also informed of the deformities of the L foot. Confirmed she would be at apt with Dr.Plumley

## 2011-05-26 NOTE — Unmapped (Signed)
LM for [name] that this was Dr. Clyda Greener soonest appt. As he is booked out fairly far.  Asked her to call us if she had any more concerns.

## 2011-05-26 NOTE — Unmapped (Signed)
Pt had to reschedule NPV to Apr and caller concerned

## 2011-06-01 ENCOUNTER — Encounter: Payer: MEDICARE | Attending: Critical Care Medicine

## 2011-06-17 MED ORDER — ciprofloxacin (CILOXAN) 0.3 % ophthalmic solution
0.3 | Freq: Four times a day (QID) | OPHTHALMIC | 0.00 refills | 13.00000 days | Status: AC
Start: 2011-06-17 — End: 2011-07-29

## 2011-06-17 NOTE — Unmapped (Signed)
Pt went to urgent care on Sunday and was diagnosed with pink eye. Pt's mother says that pt was given Erythromycin 3.5 gm to be put in eye tid, pt's mother would like to know if there is anything else that pt can have to help the pt get better because pt's eye is still red. Please call and advise

## 2011-06-17 NOTE — Unmapped (Signed)
Left message on patients machine letting her know that the RX was sent to her pharmacy

## 2011-06-17 NOTE — Unmapped (Signed)
Can change to cipro drops, two to three drops in affected eye four times daily for 7 days.  If not improved in three to five days, pt should consider seeing ophthalmology.  Med eprescribed.

## 2011-07-01 ENCOUNTER — Encounter: Payer: MEDICARE | Attending: Critical Care Medicine

## 2011-07-29 ENCOUNTER — Ambulatory Visit: Admit: 2011-07-29 | Discharge: 2011-07-29 | Payer: MEDICARE | Attending: Critical Care Medicine

## 2011-07-29 DIAGNOSIS — I6350 Cerebral infarction due to unspecified occlusion or stenosis of unspecified cerebral artery: Secondary | ICD-10-CM

## 2011-07-29 NOTE — Unmapped (Signed)
Subjective:      Patient ID: Nicole Chen is a 41 y.o. female.    HPI          Histories:     She has a past medical history of Abnormal finding; Wears glasses; Hearing impairment; Cholesteatoma; Down's syndrome; Stroke; Seizures; Cholesteatoma (1976); and Dysfunction of eustachian tube (05/03/2005).    She has past surgical history that includes Cardiac surgery (1972) and insertion pe tubes (1976).    Her family history includes Cancer in her mother and other; Diabetes in her maternal grandmother; Hearing loss in her other; Hypertension in her mother; and Other in her cousin.    She reports that she has never smoked. She does not have any smokeless tobacco history on file. She reports that she does not drink alcohol or use illicit drugs.      Review of Systems    Allergies:   Review of patient's allergies indicates no known allergies.    Medications:     Outpatient Encounter Prescriptions as of 07/29/2011   Medication Sig Dispense Refill   ??? aspirin 81 MG chewable tablet Chew 81 mg by mouth daily.       ??? levETIRAcetam (KEPPRA) 100 mg/mL solution Take 2.5 mLs (250 mg total) by mouth 2 times a day.  60 mL  5   ??? multivitamin (MULTIPLE VITAMINS) tablet        ??? pravastatin (PRAVACHOL) 10 MG tablet Take 1 tablet (10 mg total) by mouth daily.  30 tablet  5   ??? clotrimazole (LOTRIMIN) 1 % cream Apply topically 2 times a day.  45 g  0   ??? DISCONTD: ciprofloxacin (CILOXAN) 0.3 % ophthalmic solution Apply 2 drops to eye 4 times a day.  5 mL  0        Objective:       Blood pressure 98/60, height 4' 8 (1.422 m), weight 122 lb (55.339 kg).    Neurologic Exam    Physical Exam    Prior Diagnostic Testing:        Assessment:         Plan:             Time Spent With Patient:   Time spent with patient was 45 minutes  Time spent discussing diagnosis, management and treatment plan was >25 Minutes.       [id number]

## 2011-08-02 NOTE — Unmapped (Signed)
Subjective:      Patient ID: Nicole Chen is a 41 y.o. female.    HPI  This 41 year old right-handed woman, with a history of Down's syndrome, ventricular septal defect as an infant, hearing loss, was seen today for evaluation after a recent stroke.  The patient was camping with her mother in early October when she was noted to suddenly develop right-sided weakness.  She was taken to [facility] where she had a 20 or 30 second period of rhythmic head shaking that was thought to be a seizure.  She had persistent right-sided weakness and a MRI scan reportedly showed findings consistent with an acute left MCA infarct.  The patient had a standard echo at [facility], which reportedly did not show any major disturbances, but the imaging was felt to be suboptimal.  An EEG apparently showed some sharp activity in the left frontal area.  She was placed on 250 mg of levetiracetam twice a day, aspirin, and a statin agent.  I believe a carotid ultrasound study was also done, which was not felt to be significantly abnormal.  A MRI of the head apparently showed a possible small infundibulum on the right internal carotid artery.  Unfortunately, I do not have any of her brain images to review myself at present.      The patient has recovered fairly well overall.  She still is somewhat slower motor wise than she used to be and she does not bowl as well with her right arm as she used to.  She has had no further seizures.  She has no history of seizures.  She has no history of thromboembolic disease.  Her VSD surgery was done when she was an infant.  She was followed at [facility] as a youth, but has not seen a cardiologist in recent years.  There is no reported history of arrhythmias.            Review of Systems        Objective:    Physical Exam  On exam today, she had a dysarthria, which is her baseline.  She followed simple instructions correctly.  She had grossly intact  visual fields to finger counting.  She had a mild droop of her right face.  Her upper extremity exam showed no pronator drift.  Fine motor movements were mildly slow, but were symmetric.  Finger-to-nose movements were not ataxic.  Her strength was good to confrontation.  She had good lower extremity power and tone.  Her gait was notable for a wide base and a diminished arm swing bilaterally.          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  Ms. Circle had a sudden onset of right-sided weakness and reportedly had evidence of a cortical infarct on imaging.  The suddenness of event and the cortical location would be consistent with an embolic occurrence.  Embolic strokes in 60 year olds are usually due to carotid artery dissection, a cardiac source of embolus, or remain unexplained despite investigations.  Because of her history of VSD repair, I think it would be prudent to have her investigated by a cardiologist familiar with late life sequelae of VSD repairs.  She may need additional echos of her heart to rule out the persistent presence of a thrombus or a thrombogenic structure.  Atrial fibrillation would be unlikely in someone with a normal heart in her age group, but this is another reason  that a cardiologist should evaluate her more fully.      For stroke prevention, antiplatelet therapy and a statin agent are prudent.  We discussed the levetiracetam.  The odds are on the low side that she would need anticonvulsants long term.  She has been on low dose levetiracetam for 5 months with no further events.  At this point I recommended to the patient's mother that she cut the dose in half for a week or two and then stop it.  I told her to follow seizure precautions, which include no driving, no swimming, no bathing alone or no climbing high places for 90 days at least after stopping the medications.          Plan:       1.  Continue aspirin and statin.  2.  Refer to the Adult Congenital Heart Disease  Program at [facility] for an evaluation because of her recent embolic stroke.  3.  Taper off the levetiracetam as described above and follow seizure precautions.  4.  Return in several months.          DK/bk  [id number]

## 2011-10-14 MED ORDER — pravastatin (PRAVACHOL) 10 MG tablet
10 | ORAL_TABLET | Freq: Every day | ORAL | 1.00 refills | 30.00000 days | Status: AC
Start: 2011-10-14 — End: 2012-04-20

## 2011-11-11 NOTE — Unmapped (Signed)
I finally received copies of her images from [facility]. The head CT from 01/2011 was normal apart from basal ganglia calcifications. Her brain MRI with contrast showed an acute left frontal cortical infarct. There was a small old right frontal infarct as well. The MRA of the head looked normal to me. I could not appreciate a possible RICA terminus outpouching and there was no significant vertebral narrowing.    JP - Can you see if she [facility]?

## 2011-12-02 ENCOUNTER — Ambulatory Visit: Admit: 2011-12-02 | Discharge: 2011-12-02 | Payer: MEDICARE | Attending: Critical Care Medicine

## 2011-12-02 DIAGNOSIS — I6350 Cerebral infarction due to unspecified occlusion or stenosis of unspecified cerebral artery: Secondary | ICD-10-CM

## 2011-12-02 NOTE — Unmapped (Signed)
Subjective:        Patient ID: Nicole Chen is a 41 y.o. female.    HPI   This 41 year old woman with history of Down syndrome, ventricular septal defect repair as an infant, hearing loss, left MCA stroke, was last seen in April.  Nicole Chen has been neurologically stable since Nicole last visit.  Her clinical stroke occurred in October of 2012 and she developed right-sided weakness from which she has made a very good recovery.      After Nicole last visit, I requested her outside imaging for review.  Nicole CT scans a [facility] showed basal ganglia calcifications.  Her MRI study showed an acute left frontal cortical infarct as well as an old right frontal chronic infarct that was small.  Nicole MRA of Nicole head looked normal.  I did not appreciate any significant aneurysms of Nicole ICAs.      I referred Nicole patient to [facility] to see one of their cardiologists to see if her stroke might be related to her congenital heart disease and VSD repair.  They performed an echocardiogram and an EKG and apparently did not find any structural abnormalities.  They recommended a 14-day cardiac monitor looking for signs of atrial fibrillation.  This was done by Nicole family and they mailed it in, but they did not see a report.      Nicole patient says that perhaps Nicole patient does have some complaint of some chest discomfort at times.  She does not have a known arrhythmia.      Nicole Chen says that Nicole patient has been somewhat sluggish since her stroke, but has not developed any new symptoms since her last visit here.         Histories:     She has a past medical history of Abnormal finding; Wears glasses; Hearing impairment; Cholesteatoma; Down's syndrome; Stroke; Seizures; Cholesteatoma (1976); and Dysfunction of eustachian tube (05/03/2005).    She has past surgical history that includes Cardiac surgery (1972) and insertion pe tubes (1976).    Her family history includes Cancer in  her Chen and other; Diabetes in her maternal grandmother; Hearing loss in her other; Hypertension in her Chen; and Other in her cousin.    She reports that she has never smoked. She does not have any smokeless tobacco history on file. She reports that she does not drink alcohol or use illicit drugs.      Review of Systems    Allergies:   Review of patient's allergies indicates no known allergies.    Medications:     Outpatient Encounter Prescriptions as of 12/02/2011   Medication Sig Dispense Refill   ??? aspirin 81 MG chewable tablet Chew 81 mg by mouth daily.       ??? multivitamin (MULTIPLE VITAMINS) tablet        ??? pravastatin (PRAVACHOL) 10 MG tablet Take 1 tablet (10 mg total) by mouth daily.  30 tablet  5   ??? DISCONTD: clotrimazole (LOTRIMIN) 1 % cream Apply topically 2 times a day.  45 g  0   ??? DISCONTD: levETIRAcetam (KEPPRA) 100 mg/mL solution Take 2.5 mLs (250 mg total) by mouth 2 times a day.  60 mL  5        Objective:       Blood pressure 132/84, pulse 76, height 4' 8 (1.422 m), weight 125 lb (56.7 kg).    Neurologic Exam  On exam today, she had  no pronator drift.  Her fine motor movements were done quite well actually.  They were slightly slower on Nicole right than Nicole left, but still fairly rapid.  She did not have any obvious weakness to confrontation, but she did not resist vigorously with any individual muscle group.  Her gait was wide-based and she waddled a bit.      Physical Exam    Assessment:   Impression and Plan:     Nicole Chen has been neurologically stable of late.  Her MRI study from last fall shows an acute left frontal infarct as well as an old smaller right cortical infarct.  She has been neurologically stable on aspirin and statin therapy.  I recommend that this continue indefinitely.  We will try to make sure that Nicole 14-day cardiac monitor has been read.  However, I told Nicole Chen that if Nicole patient has recurrent symptoms involving chest discomfort or fluttering in her  chest, reassessment for cardiac arrhythmias would be prudent.  I would be happy to reassess her if new neurologic symptoms occur in Nicole future.      Plan:     Time Spent With Patient:   Time spent with patient was 25 minutes  Time spent discussing diagnosis, management and treatment plan was >15 Minutes.      DK/kp  [id number]

## 2011-12-13 NOTE — Unmapped (Signed)
Message copied by Jamal Collin on Mon Dec 13, 2011  4:36 PM  ------       Message from: Shellia Cleverly       Created: Mon Dec 13, 2011  1:14 PM         JP- can you let the patient's mother know that we received a copy of the cardiac monitoring report and they said everything looked ok during the recording period?       thanks

## 2011-12-13 NOTE — Unmapped (Signed)
lmom to call back.

## 2011-12-20 NOTE — Unmapped (Signed)
She is getting phone calls for her mother regarding test results etc and she is only the emergency contact. Please only call for emergences and all other things please call her mothers house.

## 2011-12-21 NOTE — Unmapped (Signed)
Called and talked to the mom and gave her previous message call completed.

## 2011-12-21 NOTE — Unmapped (Signed)
Pt's mother is calling back regarding previous message and also stating she is wanting to know pt's testing results that was left with [name].

## 2012-04-20 MED ORDER — pravastatin (PRAVACHOL) 10 MG tablet
10 | ORAL_TABLET | Freq: Every day | ORAL | 1.00 refills | 30.00000 days | Status: AC
Start: 2012-04-20 — End: 2012-10-18

## 2012-04-24 ENCOUNTER — Ambulatory Visit: Admit: 2012-04-24 | Discharge: 2012-04-24 | Payer: MEDICARE | Attending: Audiologist-Hearing Aid Fitter

## 2012-04-24 ENCOUNTER — Ambulatory Visit: Admit: 2012-04-24 | Discharge: 2012-04-24 | Payer: MEDICARE | Attending: Otology & Neurotology

## 2012-04-24 ENCOUNTER — Encounter: Payer: MEDICARE | Attending: Otology & Neurotology

## 2012-04-24 ENCOUNTER — Encounter: Payer: MEDICARE | Attending: Audiologist-Hearing Aid Fitter

## 2012-04-24 DIAGNOSIS — H902 Conductive hearing loss, unspecified: Secondary | ICD-10-CM

## 2012-04-24 NOTE — Unmapped (Signed)
ASSESSMENT & PLAN:   Dear Dr. Si Gaul,   I had the pleasure of seeing your patient in consultation. Barring hearing from you otherwise, I will proceed with the recommendations listed below.   41 yo F h/o cholesteatoma, s/p mastoidectomy, with bilateral hearing aids for CHL.   - Continue dry ear precautions   - Keep AD hearing aid out for a week  - Mastoid powder applied, granulation tissue had silver nitrate applied  - F/U 2 months with CT orbits/sella/iac  Continue AS hearing aid for CHL.   NED for cholesteatoma.   Consider AS Goode t-tube placement.

## 2012-04-24 NOTE — Unmapped (Signed)
Nicole Chen is a 41 y.o. 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  Abnormal finding       ent problems    ???  Wears glasses     ???  Hearing impairment     ???  Cholesteatoma     ???  Down's syndrome     SURGICAL HISTORY:   No past surgical history on file.   Prior history of ear surgery / skull base surgery / head and neck surgery - right canal wall reconstruction 2006 and 2nd look tympanoplasty 2007   FAMILY HISTORY:   Family History    Problem  Relation  Age of Onset    ???  Hearing loss  Other         grandmother      ???  Cancer  Other     ???  Other  Cousin         brain injury    History of cancers, ear disease, brain tumors - none   SOCIAL HISTORY:   History      Social History    ???  Marital Status:  Single      Spouse Name:  N/A      Number of Children:  N/A    ???  Years of Education:  N/A      Occupational History    ???  Not on file.      Social History Main Topics    ???  Smoking status:  Never Smoker    ???  Smokeless tobacco:  Not on file       Comment: 05-03-2005      ???  Alcohol Use:  No       05-03-2005    ???  Drug Use:  No       05-03-2005    ???  Sexually Active:       Other Topics  Concern    ???  Not on file      Social History Narrative    ???  No narrative on file    History if illegal drug use, HIV risk factors, blood transfusions - none   History of tobacco or alcohol use - no smoke, no EtOH   MEDICATIONS:   Outpatient Encounter Prescriptions as of 03/22/2011    Medication  Sig  Dispense  Refill    ???  multivitamin (MULTIPLE VITAMINS) tablet       ALLERGIES:   No Known Allergies   PHYSICAL EXAM:   Blood pressure 116/84, pulse 72, resp. rate 20, height 4' 8 (1.422 m), weight 122 lb (55.339 kg).   GENERAL: Well-developed, well-nourished person in  no acute distress. I was able to converse well with the patient. The patient is able to answer questions adequately and appropriately.   PSYCHIATRIC: Down's.  EARS: External ears have a normal appearance with no masses, lesions, or scars. Tuning fork tests not performed.  NEUROLOGIC: Facial nerve function grade I. Other cranial nerves grossly WNL.      PROCEDURE NOTE: Otomicroscopic exam was done of both EAC's; other than wax, EAC's left EAC is normal, TM has effusion. Right EAC is  erythematous, with debris. TM is thickened, mobile, no perforation noted.   GRANULATION TISSUE CAUTERIZED WITH SILVER NITRATE; MASTOID POWDER SPRAYED.    ASSESSMENT & PLAN:   Dear Dr. Si Gaul,   I had the pleasure of seeing your patient in consultation. Barring hearing from you otherwise, I will proceed with the recommendations listed below.   41 yo F h/o cholesteatoma, s/p mastoidectomy, with bilateral hearing aids for CHL.   - Continue dry ear precautions   - Keep AD hearing aid out for a week  - Mastoid powder applied, granulation tissue had silver nitrate applied  - F/U 2 months with CT orbits/sella/iac  Continue AS hearing aid for CHL.   NED for cholesteatoma.   Consider AS Goode t-tube placement.      Medical Decision Making:   * review/order other diagnostic or tx interventions   * discussed case with another provider (letter sent)   * Hearing Test Reviewed: yes   Patient Education:   * verbal information and/or pamphlet/ literature given to patient.   Risks & Benefits:   * Risks, benefits and treatment options discussed with patient.   PRECEPTOR ACKNOWLEDGEMENTS   I saw and examined the patient. I discussed with resident and agree with resident's findings and plan as documented in resident's note.

## 2012-09-25 ENCOUNTER — Ambulatory Visit: Admit: 2012-09-25 | Discharge: 2012-09-25 | Payer: MEDICARE | Attending: Otology & Neurotology

## 2012-09-25 ENCOUNTER — Ambulatory Visit: Admit: 2012-09-25 | Discharge: 2012-09-25 | Payer: MEDICARE | Attending: Audiologist-Hearing Aid Fitter

## 2012-09-25 DIAGNOSIS — H906 Mixed conductive and sensorineural hearing loss, bilateral: Secondary | ICD-10-CM

## 2012-09-25 DIAGNOSIS — H7190 Unspecified cholesteatoma, unspecified ear: Secondary | ICD-10-CM

## 2012-09-25 NOTE — Unmapped (Signed)
Assessment & Plan:  Dear Dr. Nelta Numbers,  42 y.o. 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 May use hair drier for a few seconds to each ear.  - Continue with hearing aids AU  - High Resolution CT scan of temporal bone  - Possible PE tube placement AS  - RTC in 6 months with CT scan  May need MRI DWI

## 2012-09-25 NOTE — Unmapped (Signed)
Chief Complaint   Patient presents with   ??? Hearing Loss     F/u       Cancer Staging:  No matching staging information was found for the patient.      History of Present Illness:  Patient ID: Nicole Chen is a 42 y.o. female presents to the clinic for hearing loss f/u.  Pt is doing well. Hearing loss is stable. Word clarity has decreased, this however has been gradual since her stroke in 2012. She wears hearing aids AU and [name] is moving forward with converting to digital hearing aids. She uses wax plugs when swimming.    Pt denies ear infections, otorrhea, otalgia, worsening hearing loss, dizziness    PMHx: Stroke 2012, TIA prior to 2012, seizures    Past Medical History:  She has a past medical history of Abnormal finding; Wears glasses; Hearing impairment; Cholesteatoma; Down's syndrome; Stroke; Seizures; Cholesteatoma (1976); and Dysfunction of eustachian tube (05/03/2005).  Past Surgical History:  She has past surgical history that includes Cardiac surgery (1972) and insertion pe tubes (1976).  Family History:  Her family history includes Cancer in her mother and other; Diabetes in her maternal grandmother; Hearing loss in her other; Hypertension in her mother; and Other in her cousin.  Social History:  She reports that she has never smoked. She does not have any smokeless tobacco history on file. She reports that she does not drink alcohol or use illicit drugs.    Medications:  Current Outpatient Prescriptions   Medication Sig Dispense Refill   ??? aspirin 81 MG chewable tablet Chew 81 mg by mouth daily.       ??? multivitamin (MULTIPLE VITAMINS) tablet        ??? pravastatin (PRAVACHOL) 10 MG tablet Take 1 tablet (10 mg total) by mouth daily.  30 tablet  5     No current facility-administered medications for this visit.       Allergies:  Review of patient's allergies indicates no known allergies.    Physical Exam:  General:  Well-developed, well-nourished female in no acute distress.  I was not able to  converse well with the patient.  The patient is not able to answer questions adequately and appropriately. Sister is present and main historian.  Lymphatic:  No lymphadenopathy on neck palpation.  Psychiatric:  NL mood and affect.  A+O x 4.  Neck: Overall appearance NL.  No masses.  Neck symmetric with NL tracheal position.  No crepitus.  No masses, tenderness, or enlargement of thyroid on palpation.  Salivary glands NL size.    Head and Face: AT-NC.   Eye:  Ocular motility NL.  No erythema or infection.  Nose:  Anterior rhinoscopy performed.  Mucosa intact. Mild non-purulent rhinorrhea bilaterally. No masses, polyps, or pus.  No septal perforation.  External nose without infection or abnormality.    OC/OP:  Lips, teeth, and gums are in good condition.  Mucosa:  No leukoplakia or masses.  Hard/soft palates and tongue of NL symmetry. High arching palate.  Pharyngeal walls and tonsillar fossae without abnormalities.   Skin:  Visual examination and palpation of the scalp, neck, and head reveals no significant rashes, ulcerations, nodules, or lesions.  Ears:  External ears have a normal appearance with no masses, lesions, or scars.  Tuning fork tests performed at 512 Hz.  Weber midline.  Rinne was positive bilaterally.    Neurologic:  Facial nerve function grade I.  Other cranial nerves grossly WNL.  Procedure Note:  Otomicroscopic exam was done of both EAC's:  Right: other than wax, EAC normal; TM with post-surgical graft changes, middle ear clear, -fistula test. POSSIBLE CHRONIC MYRINGITIS. MASTOID POWDER SPRAYED.    Left: other than wax, EAC normal; TM with deep retraction pocket without evidence of cholesteatoma, MUCOID MIDDLE EAR EFFUSION, -fistula test.    Assessment & Plan:  Dear Dr. Nelta Numbers,  42 y.o. white female with ETD and 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 May use hair drier for a few seconds to each ear.  - Continue with hearing aids AU  - High Resolution CT scan of temporal bone  - Possible PE tube placement AS  - RTC in 6 months with CT scan  May need MRI DWI    I have seen and examined the patient. I have discussed with the PA and agree with the findings and plan as documented in the note.     Medical history questionnaire was reviewed by me with the patient. Review of systems is negative except as noted in the HPI and the scanned document dated from today.   Medical Decision Making:   * review/order radiology tests   * review/order other diagnostic or tx interventions   * discussed case with another provider (letter sent)   * Hearing Test Reviewed: yes   Patient Education:   * verbal information and/or pamphlet/ literature given to patient.   Risks & Benefits:   * Risks, benefits and treatment options discussed with patient.

## 2012-09-25 NOTE — Unmapped (Signed)
Hearing aid check  Has old hearing aid from 2007 from [place] and a Milo from [facility] from 2012 self pay  Will submit for new bilateral Dalia ITEs with push button and raised VC  Will call Mother when approval received

## 2012-09-25 NOTE — Unmapped (Signed)
See attached audiogram

## 2012-10-10 NOTE — Unmapped (Signed)
Addended by: Cloyde Reams T on: 10/10/2012 12:16 PM     Modules accepted: Orders

## 2012-10-11 NOTE — Unmapped (Signed)
Addended by: Chauncey Cruel on: 10/11/2012 10:41 AM     Modules accepted: Orders

## 2012-10-18 MED ORDER — pravastatin (PRAVACHOL) 10 MG tablet
10 | ORAL_TABLET | Freq: Every day | ORAL | 1.00 refills | 30.00000 days | Status: AC
Start: 2012-10-18 — End: 2013-01-15

## 2013-01-15 MED ORDER — pravastatin (PRAVACHOL) 10 MG tablet
10 | ORAL_TABLET | ORAL | 1.00 refills | 30.00000 days | Status: AC
Start: 2013-01-15 — End: 2013-10-16

## 2013-04-02 ENCOUNTER — Ambulatory Visit: Admit: 2013-04-02 | Discharge: 2013-04-02 | Payer: MEDICARE | Attending: Otology & Neurotology

## 2013-04-02 ENCOUNTER — Inpatient Hospital Stay: Admit: 2013-04-02 | Payer: MEDICARE

## 2013-04-02 DIAGNOSIS — H7190 Unspecified cholesteatoma, unspecified ear: Secondary | ICD-10-CM

## 2013-04-02 DIAGNOSIS — H7090 Unspecified mastoiditis, unspecified ear: Secondary | ICD-10-CM

## 2013-04-02 NOTE — Unmapped (Signed)
No chief complaint on file.    History of Present Illness:  Patient ID: Nicole Chen is a 42 y.o. female with h/o Down's Syndrome and AD cholesteatoma s/p CWR 2006.  Working on getting new hearing aids, insurance an issue currently.  Denies any infections, otorrhea, or change in hearing.      Past Medical History:  She has a past medical history of Abnormal finding; Wears glasses; Hearing impairment; Cholesteatoma; Down's syndrome; Stroke; Seizures; Cholesteatoma (1976); and Dysfunction of eustachian tube (05/03/2005).  Past Surgical History:  She has past surgical history that includes Cardiac surgery (1972) and insertion pe tubes (1976).  Family History:  Her family history includes Cancer in her mother and other; Diabetes in her maternal grandmother; Hearing loss in her other; Hypertension in her mother; Other in her cousin.  Social History:  She reports that she has never smoked. She does not have any smokeless tobacco history on file. She reports that she does not drink alcohol or use illicit drugs.    Medications:  Current Outpatient Prescriptions   Medication Sig Dispense Refill   ??? aspirin 81 MG chewable tablet Chew 81 mg by mouth daily.       ??? multivitamin (MULTIPLE VITAMINS) tablet        ??? pravastatin (PRAVACHOL) 10 MG tablet TAKE 1 TABLET BY MOUTH DAILY  30 tablet  5     No current facility-administered medications for this visit.     Allergies:  Review of patient's allergies indicates no known allergies.    Physical Exam:  General:  Well-developed, well-nourished female in no acute distress.  I was able to converse well with the patient.  The patient is able to answer questions adequately and appropriately.  Lymphatic:  No lymphadenopathy on neck palpation.  Psychiatric:  NL mood and affect.  A+O x 4.  Neck: Overall appearance NL.  No masses.  Neck symmetric with NL tracheal position.  No crepitus.  No masses, tenderness, or enlargement of thyroid on palpation.  Salivary glands NL size.    Head and  Face:  No sinus tenderness.  Eye:  Ocular motility NL.  No erythema or infection.  Nose:  Anterior rhinoscopy performed.  Mucosa intact.  No masses, polyps, or pus.  No septal perforation.  External nose without infection or abnormality.    OC/OP:  Lips, teeth, and gums are in good condition.  Mucosa:  No leukoplakia or masses.  Hard/soft palates (high arching) tongue of NL symmetry.  Pharyngeal walls and tonsillar fossae without abnormalities.   Cardiovascular:  Examination of the carotid arteries upon palpation reveals no increase in pulse amplitude.    Skin:  Visual examination and palpation of the scalp, neck, and head reveals no significant rashes, ulcerations, nodules, or lesions.  Ears:  External ears have a normal appearance with no masses, lesions, or scars.    Neurologic:  Facial nerve function grade I.  Other cranial nerves grossly WNL.      Procedure Note:  Otomicroscopic exam was done of both EAC's; other than wax, EAC's normal.  AD:  TM with obvious post-surgical changes, cartilage graft visible, mobile, +fluid middle ear space, -fistula test.  AS: TM with retraction, no evidence of squamous debris, +fluid middle ear space    Assessment & Plan:  Dear Dr. Si Gaul,  42 y.o. female with AU ETD with h/o AD cholesteatoma s/p CWR 2006 with 2nd look 2007 and Down's Syndrome  - AU serous otitis media  - CT temporal bone  reviewed: soft tissue noted in both middle ears  - Continue hearing aids AU   - Discussed possible PET, but pt did not like having them in the past  - Baby oil and hydrogen peroxide weekly  - RTC 12-18 months with audio to see me.  F/u with my PA, Adele Rauen, in 6 months for ear cleaning.    I have seen and examined the patient. I have discussed with the resident/fellow and agree with the findings and plan as documented in the note.         Medical history questionnaire was reviewed by me with the patient. Review of systems is negative except as noted in the HPI and the scanned  document dated from today.   Medical Decision Making:   * review/order radiology tests   * review/order other diagnostic or tx interventions   * discussed case with another provider (letter sent)   * Hearing Test Reviewed: yes   Patient Education:   * verbal information and/or pamphlet/ literature given to patient.   Risks & Benefits:   * Risks, benefits and treatment options discussed with patient.

## 2013-04-02 NOTE — Unmapped (Signed)
Assessment & Plan:  Dear Dr. Si Gaul,  42 y.o. female with AU ETD with h/o AD cholesteatoma s/p CWR 2006 with 2nd look 2007 and Down's Syndrome  - AU serous otitis media  - CT temporal bone reviewed: soft tissue noted in both middle ears  - Continue hearing aids AU   - Discussed possible PET, but pt did not like having them in the past  - Baby oil and hydrogen peroxide weekly  - RTC 12-18 months with audio to see me.  F/u with my PA, Adele Rauen, in 6 months for ear cleaning.

## 2013-09-20 NOTE — Unmapped (Signed)
Pt has Down's syndrome and is asking if you can do a [employer] physical.   Mom has a form that you have to fill out.   Can we sched this ?  Is this just a regular phys that we usually do ?

## 2013-09-20 NOTE — Unmapped (Signed)
Mom called.  I tried sched'ing this appt, however she has to have the phys by 6/10, and the only day her mth can bring her in is Thurs. 6/04 . She is asking if you can add her in this day.   * mom gave notice to her job and cannot miss any days, and this is her only day off.

## 2013-09-20 NOTE — Unmapped (Signed)
Left message with pt mother evelyn to sched appt for pt

## 2013-09-20 NOTE — Unmapped (Signed)
I could see her at 7:30 AM.

## 2013-09-20 NOTE — Unmapped (Signed)
Regular physical should be fine. Thanks!

## 2013-09-20 NOTE — Unmapped (Signed)
Spoke with pt mother evelyn  She says she will call us back when she gets home regarding appt

## 2013-09-21 NOTE — Unmapped (Signed)
I am booked otherwise for that day - 7:30 AM or nothing.

## 2013-09-21 NOTE — Unmapped (Signed)
Called pt, spoke to mother Renea Ee  She can not make ov offered for 09/27/13 7:30 am for physical  I will speak to Dr Nelta Numbers again to see if another time available or not  Pt will call back on Monday to speak to Ryann on fu

## 2013-09-21 NOTE — Unmapped (Addendum)
Called pts mtr Nicole Chen back. Left mess with info on her mail box that Dr Nelta Numbers can only overbook 09/27/13 7:30 am  Otherwise we will have to sched pt another day  Left all this info on pts mtr voice mail

## 2013-10-10 ENCOUNTER — Ambulatory Visit: Admit: 2013-10-10 | Discharge: 2013-10-10 | Payer: MEDICARE

## 2013-10-10 DIAGNOSIS — H6120 Impacted cerumen, unspecified ear: Secondary | ICD-10-CM

## 2013-10-10 NOTE — Unmapped (Signed)
Chief Complaint   Patient presents with   ??? Cerumen Impaction       Cancer Staging:  No matching staging information was found for the patient.      History of Present Illness:  Patient ID: Nicole Chen is a 43 y.o. female presents to the clinic for ear cleaning.  Hearing aids are not working. Hear for ear cleaning. She does not tolerate HP and oil well.     Pt denies otorrhea, otalgia    Patient present with mother today.    Past Medical History:  She has a past medical history of Abnormal finding; Wears glasses; Hearing impairment; Cholesteatoma; Down's syndrome; Stroke; Seizures; Cholesteatoma (1976); and Dysfunction of eustachian tube (05/03/2005).  Past Surgical History:  She has past surgical history that includes Cardiac surgery (1972) and insertion pe tubes (1976).  Family History:  Her family history includes Cancer in her mother and other; Diabetes in her maternal grandmother; Hearing loss in her other; Hypertension in her mother; Other in her cousin.  Social History:  She reports that she has never smoked. She does not have any smokeless tobacco history on file. She reports that she does not drink alcohol or use illicit drugs.    Medications:  Current Outpatient Prescriptions   Medication Sig Dispense Refill   ??? aspirin 81 MG chewable tablet Chew 81 mg by mouth daily.       ??? multivitamin (MULTIPLE VITAMINS) tablet        ??? pravastatin (PRAVACHOL) 10 MG tablet TAKE 1 TABLET BY MOUTH DAILY  30 tablet  5     No current facility-administered medications for this visit.       Allergies:  Review of patient's allergies indicates no known allergies.    Physical Exam:  General:  Well-developed, well-nourished female in no acute distress.  I was not able to converse well with the patient.  The patient is not able to answer questions adequately and appropriately. Mother is main historian.  Psychiatric:  NL mood and affect.  A+O x 4.  Head and Face: AT-NC.   Eye:  Ocular motility NL.  No erythema or  infection.  Skin:  Visual examination and palpation of the scalp, neck, and head reveals no significant rashes, ulcerations, nodules, or lesions.  Ears:  External ears have a normal appearance with no masses, lesions, or scars.   Cardiovascular:  Warm and well perfused.  Neurologic:  Facial nerve function grade I.  Other cranial nerves grossly WNL.     Procedure Note:  Otomicroscopic exam was done of both EAC's:  Right: EAC normal, mild cerumen removed with loops; TM with post-surgical graft changes, hypomobile, and light reflex is present in anterior inferior quadrant; middle ear clear  Left: EAC normal, mild cerumen removed with loops; TM with mild retraction pocket of pars tensa without debris, myringosclerosis, mobile, and light reflex is present in anterior inferior quadrant; middle ear clear    Assessment & Plan:  Dear Dr. Orlin Hilding and Dr. Nelta Numbers,  43 y.o. white female with mixed hearing loss AU, cerumen impaction AU  PMHx: Trisomy 21, PE tubes AU, tympanoplasty with mastoidectomy CWR AD in 2006 by Dr. Orlin Hilding for cholesteatoma  - Previous Audiogram reviewed with pt: mixed hearing loss AU  - Previous CT reviewed: Right: Postsurgical changes including partial mastoidectomy and removal of the right ossicles. 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   Left: Near complete opacification of the middle ear and mastoid antrum. This is likely  consistent with significant otomastoiditis and early cholesteatoma formation should be a consideration.  Dr. Orlin Hilding reviewed personally at last patient visit.  - Referral to audiology for hearing aid evaluation. Make appointment at check out today.  - Use hydrogen peroxide, acetic acid, mineral/olive/ sweet tree oil, or Debrox solution to ears for prevention of wax accumulation. Avoid Q-tip and bobby pin use.  - RTC in 6 months with Dr. Orlin Hilding and audiogram, 1 year with myself for ear cleaning    Note: If you have not done so already, please sign  up for My Chart Health Portal at [email or web address] using the number provided on your After Visit Summary. It is an excellent way to communicate with the office as well as review your personal health records and testing results.    Medical history questionnaire was reviewed by me with the patient. Review of systems is negative except as noted in the HPI and the scanned document dated from today.   Medical Decision Making:   * review/order radiology tests   * review/order other diagnostic or tx interventions   * discussed case with another provider (letter sent)   * Hearing Test Reviewed: yes   Patient Education:   * verbal information and/or pamphlet/ literature given to patient.   Risks & Benefits:   * Risks, benefits and treatment options discussed with patient.

## 2013-10-10 NOTE — Unmapped (Signed)
43 y.o. white female with mixed hearing loss AU  PMHx: Trisomy 21, PE tubes AU, tympanoplasty with mastoidectomy CWR AD in 2006 by Dr. Orlin Hilding  - Previous Audiogram reviewed with pt: mixed hearing loss AU  - Previous CT reviewed: Right: Postsurgical changes including partial mastoidectomy and removal of the right ossicles. 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   Left: Near complete opacification of the middle ear and mastoid antrum. This is likely consistent with significant otomastoiditis and early cholesteatoma formation should be a consideration.  Dr. Orlin Hilding reviewed personally at last patient visit.  - Referral to audiology for hearing aid evaluation. Make appointment at check out today.  - RTC in 6 months with Dr. Orlin Hilding and audiogram, 1 year with myself for ear cleaning    Note: If you have not done so already, please sign up for My Chart Health Portal at [email or web address] using the number provided on your After Visit Summary. It is an excellent way to communicate with the office as well as review your personal health records and testing results.

## 2013-10-16 MED ORDER — pravastatin (PRAVACHOL) 10 MG tablet
10 | ORAL_TABLET | ORAL | 1.00 refills | 30.00000 days | Status: AC
Start: 2013-10-16 — End: 2013-10-16

## 2013-10-16 MED ORDER — pravastatin (PRAVACHOL) 10 MG tablet
10 | ORAL_TABLET | ORAL | 1.00 refills | 30.00000 days | Status: AC
Start: 2013-10-16 — End: 2014-02-13

## 2013-10-16 NOTE — Unmapped (Signed)
Refill request: Pravastatin  Last OV: 1.10.13  Last filled: 9.22.14

## 2013-10-18 ENCOUNTER — Ambulatory Visit: Admit: 2013-10-18 | Payer: MEDICARE | Attending: Critical Care Medicine

## 2013-10-18 ENCOUNTER — Other Ambulatory Visit: Admit: 2013-10-18 | Payer: MEDICARE

## 2013-10-18 DIAGNOSIS — I6350 Cerebral infarction due to unspecified occlusion or stenosis of unspecified cerebral artery: Secondary | ICD-10-CM

## 2013-10-18 DIAGNOSIS — I639 Cerebral infarction, unspecified: Secondary | ICD-10-CM

## 2013-10-18 LAB — ANA COMPREHENSIVE PANEL
Anti JO-1: POSITIVE
JO1 Ratio: 3.43 ratio (ref 0.00–0.90)
SCL70 Ratio: 1.32 ratio (ref 0.00–0.90)
SM Antibody: UNDETERMINED
SM Ratio: 1.09 ratio (ref 0.00–0.90)
SM/ RNP AB: POSITIVE
SMRNP Ratio: 1.22 ratio (ref 0.00–0.90)
SSA (RO) Ab: POSITIVE
SSA/RO Ratio: 1.97 ratio (ref 0.00–0.90)
SSB (LA) Ab: POSITIVE
SSB/LA Ratio: 1.89 ratio (ref 0.00–0.90)
Scleroderma SCL-70: POSITIVE

## 2013-10-18 LAB — ANTI-CARDIOLIPIN ANTIBODY
Anti-Cardiolipin IgA Ratio: 4.02 [APL'U] (ref 0.00–11.99)
Anti-Cardiolipin IgG Ratio: 10.51 [GPL'U] (ref 0.00–19.99)
Anti-Cardiolipin IgM Ratio: 3.59 [MPL'U] (ref 0.00–19.99)
Anticardiolipin Ab, IgG: NEGATIVE
Cardiolipin Ab (IgA): NEGATIVE
Cardiolipin Ab (IgM): NEGATIVE

## 2013-10-18 LAB — DOUBLE STRANDED DNA AB: ds DNA Ab: 1 [IU]/mL (ref 0–9)

## 2013-10-18 LAB — LUPUS ANTICOAGULANT EVAL W/ REFLEX
DRVVT Screen: 45.1 seconds (ref 30.1–43.5)
Drvv Screen Mix: 42.8 seconds (ref 30.1–43.5)
INR: 1.1 (ref 0.9–1.1)
PTT-LA: 40 seconds (ref 30.9–45.4)
Protime: 13.8 seconds (ref 11.6–14.4)
Thrombin Time: 16.7 seconds (ref 15.0–20.0)
aPTT: 32.2 seconds (ref 24.3–33.1)

## 2013-10-18 LAB — ANA W/REFLEX TO IFA/COMP PANEL
ANA Ratio: 9.4 ratio (ref 0.00–0.99)
ANA Screen: POSITIVE

## 2013-10-18 LAB — ANA CONFIRMATION (IFA) WITH TITER: ANA Titer by IFA: NEGATIVE

## 2013-10-18 NOTE — Unmapped (Signed)
Subjective:      Patient ID: Nicole Chen is a 43 y.o. female.    HPI   This 43 year old woman with a history of Down syndrome, ventricular septal defect repair as an infant, hearing loss, left MCA stroke, was last seen in April 2013.  The patient's stroke occurred the prior year while she was camping.  There was some rhythmic shaking of the right side at the onset of the stroke.  Her imaging at [facility] showed a left MCA stroke.  She was placed on levetiracetam and had a carotid ultrasound done.  When I reviewed her imaging, she had evidence of an acute left frontal infarct as well as an old right frontal infarct.  The MRA of the head looked normal to my view.  She had had a carotid ultrasound of some type that was felt to be unremarkable.  I referred her to the adult congenital heart disease program at [facility] where she has been followed periodically.  There are no ongoing heart issues of concern according to their notes.    The patient has been free of new focal neurologic events until a couple of months ago.  She got out of a car and appeared to be dizzy or unsteady briefly.  She was clenching and unclenching her right fist and saying that something was up with her hand.  She did not have any rhythmic jerking or alteration in awareness and the episode resolved relatively quickly.  Subsequent to this, she had some additional cardiac investigations including an echo and an outpatient cardiac event monitor which did not show any items of concern.  She remains on aspirin and a statin.    Her mother said that she is somewhat slower and more fatigued now compared to where she was a couple of years ago.    Histories:     She has a past medical history of Abnormal finding; Wears glasses; Hearing impairment; Cholesteatoma; Down's syndrome; Stroke; Seizures; Cholesteatoma (1976); and Dysfunction of eustachian tube (05/03/2005).    She has past surgical history that  includes Cardiac surgery (1972) and insertion pe tubes (1976).    Her family history includes Cancer in her mother and other; Diabetes in her maternal grandmother; Hearing loss in her other; Hypertension in her mother; Other in her cousin.    She reports that she has never smoked. She does not have any smokeless tobacco history on file. She reports that she does not drink alcohol or use illicit drugs.      Review of Systems    Allergies:   Review of patient's allergies indicates no known allergies.    Medications:     Outpatient Encounter Prescriptions as of 10/18/2013   Medication Sig Dispense Refill   ??? aspirin 81 MG chewable tablet Chew 81 mg by mouth daily.       ??? multivitamin (MULTIPLE VITAMINS) tablet        ??? pravastatin (PRAVACHOL) 10 MG tablet TAKE 1 TABLET BY MOUTH EVERY DAY  90 tablet  0     No facility-administered encounter medications on file as of 10/18/2013.        Objective:       Height 4' 8 (1.422 m), weight 132 lb (59.875 kg).    Neurologic Exam  On brief exam today, she showed reasonable visual attention.  There was no obvious facial asymmetry.  She lifted both arms up in the air well and moved her hands and fingers symmetrically  and fairly well.  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    Physical Exam    Prior Diagnostic Testing:     Assessment:   IMPRESSION:  Nicole Chen has not had any further strokes since 2012 as far as I can tell on clinical grounds.  The cause of her original stroke remains unclear.  An embolic stroke was likely based on an sudden onset and distribution.  Patients with unexplained embolic strokes often do well on aspirin therapy.  Cold atrial fibrillation would obviously raise a risk of additional stroke in the future and anticoagulation would be recommended if that were present here.    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  It  is possible that she has Sneddon???s syndrome which would raise a risk of additional stroke and lead to the question of whether she should be on anticoagulation long-term.  The patient may be reluctant to hold still for the blood tests but I thought we should try to obtain one today.  If these blood tests for antiphospholipid antibodies are negative, I would continue her current therapy indefinitely of aspirin and statins indefinitely.      Plan:   PLAN: Blood work for anticardiolipin antibodies and a Russell viper venom screen.     ADDENDUM: her blood tests did not show evidence of an antiphospholipid antibody syndrome. Some of her autoimmune antibodies on the panel were above the normal range. This is of doubtful clinical significance.    Time Spent With Patient:   Time spent with patient was 25 minutes  Time spent discussing diagnosis, management and treatment plan was >15 Minutes.      DK/tlm   [id number]

## 2013-11-05 ENCOUNTER — Ambulatory Visit: Admit: 2013-11-05 | Discharge: 2013-11-05 | Payer: MEDICARE | Attending: Audiologist-Hearing Aid Fitter

## 2013-11-05 ENCOUNTER — Encounter: Payer: MEDICARE | Attending: Audiologist-Hearing Aid Fitter

## 2013-11-05 DIAGNOSIS — H902 Conductive hearing loss, unspecified: Secondary | ICD-10-CM

## 2013-11-05 NOTE — Unmapped (Signed)
[facility] for hearing aids

## 2013-11-16 ENCOUNTER — Ambulatory Visit: Admit: 2013-11-16 | Discharge: 2013-11-16 | Payer: MEDICARE | Attending: Audiologist-Hearing Aid Fitter

## 2013-11-16 DIAGNOSIS — H906 Mixed conductive and sensorineural hearing loss, bilateral: Secondary | ICD-10-CM

## 2013-11-16 NOTE — Unmapped (Signed)
Hearing aid fit  [employer] until 12/06/15  [id number] - R  [id number] - L  Set at 100% with two programs and volume control  Went over use and care especially with mother...  Follow up in three weeks

## 2013-12-03 ENCOUNTER — Ambulatory Visit: Admit: 2013-12-03 | Payer: MEDICARE

## 2013-12-03 ENCOUNTER — Ambulatory Visit: Admit: 2013-12-03 | Discharge: 2013-12-03 | Payer: MEDICARE

## 2013-12-03 DIAGNOSIS — Z0000 Encounter for general adult medical examination without abnormal findings: Secondary | ICD-10-CM

## 2013-12-03 DIAGNOSIS — Q909 Down syndrome, unspecified: Secondary | ICD-10-CM

## 2013-12-03 LAB — CBC
Hematocrit: 41.2 % (ref 35.0–45.0)
Hemoglobin: 14.2 g/dL (ref 11.7–15.5)
MCH: 34.9 pg (ref 27.0–33.0)
MCHC: 34.5 g/dL (ref 32.0–36.0)
MCV: 101.1 fL (ref 80.0–100.0)
MPV: 7.3 fL (ref 7.5–11.5)
Platelets: 354 10*3/uL (ref 140–400)
RBC: 4.08 10*6/uL (ref 3.80–5.10)
RDW: 13.4 % (ref 11.0–15.0)
WBC: 6.3 10*3/uL (ref 3.8–10.8)

## 2013-12-03 LAB — DIFFERENTIAL
Basophils Absolute: 50 /uL (ref 0–200)
Basophils Relative: 0.8 % (ref 0.0–1.0)
Eosinophils Absolute: 107 /uL (ref 15–500)
Eosinophils Relative: 1.7 % (ref 0.0–8.0)
Lymphocytes Absolute: 1411 /uL (ref 850–3900)
Lymphocytes Relative: 22.4 % (ref 15.0–45.0)
Monocytes Absolute: 504 /uL (ref 200–950)
Monocytes Relative: 8 % (ref 0.0–12.0)
Neutrophils Absolute: 4227 /uL (ref 1500–7800)
Neutrophils Relative: 67.1 % (ref 40.0–80.0)

## 2013-12-03 LAB — HEMOGLOBIN A1C: Hemoglobin A1C: 4.8 % (ref 4.8–6.4)

## 2013-12-03 LAB — THYROID FUNCTION CASCADE: TSH: 1.69 u[IU]/mL (ref 0.34–5.60)

## 2013-12-03 LAB — RENAL FUNCTION PANEL W/EGFR
Albumin: 4 g/dL (ref 3.5–5.7)
Anion Gap: 6 mmol/L (ref 3–16)
BUN: 17 mg/dL (ref 7–25)
CO2: 29 mmol/L (ref 21–33)
Calcium: 8.9 mg/dL (ref 8.6–10.3)
Chloride: 102 mmol/L (ref 98–110)
Creatinine: 0.82 mg/dL (ref 0.60–1.30)
GFR MDRD Af Amer: 92 See note.
GFR MDRD Non Af Amer: 76 See note.
Glucose: 97 mg/dL (ref 70–100)
Osmolality, Calculated: 285 mOsm/kg (ref 278–305)
Phosphorus: 4 mg/dL (ref 2.1–4.7)
Potassium: 3.7 mmol/L (ref 3.5–5.3)
Sodium: 137 mmol/L (ref 133–146)

## 2013-12-03 LAB — LIPID PANEL
Cholesterol, Total: 132 mg/dL (ref 0–200)
HDL: 54 mg/dL (ref 23–92)
LDL Cholesterol: 58 mg/dL (ref 0–100)
Triglycerides: 100 mg/dL (ref 48–352)

## 2013-12-03 NOTE — Unmapped (Signed)
Screening CBC, TSH. Refer for sleep study.

## 2013-12-03 NOTE — Unmapped (Signed)
UTD with screening. Deferring pap 2/2 never sexually active and would be very traumatizing to her.

## 2013-12-03 NOTE — Unmapped (Signed)
Counseled on cutting out regular soda.

## 2013-12-03 NOTE — Unmapped (Signed)
Subjective  HPI:   Patient ID: Nicole Chen is a 43 y.o. female.    Chief Complaint: physical  HPI       43 y/o F with h/o down's syndrome, CVA suspect embolic of unknown etiology and overweight here for physical.    CVA: follows with neuro. On ASA and statin for further stroke prevention.    Down's: due for routine screening of thyroid, CBC. Screening for OSA?    HM: Never been sexually active, so deferring pap. Due for labs. Due for tdap (last tetanus 2005) - should get at pharmacy. Depression screen negative. Doesn't drink alcohol.     Annual Wellness Exam and Personalized Prevention Plan    Nicole Chen is a 43 y.o. woman here today for an initial Annual Wellness Exam.    Health Risk Assessment and Functional Status Review: Questionnaire completed by patient and I reviewed it and we discussed the following issues:  ?? None identified.     Patient's Medical and Surgical History:      Past Medical History   Diagnosis Date   ??? Abnormal finding      ent problems   ??? Wears glasses    ??? Hearing impairment    ??? Cholesteatoma    ??? Down's syndrome    ??? Stroke    ??? Seizures    ??? Cholesteatoma 1976     Rt ear s/p resection   ??? Dysfunction of eustachian tube 05/03/2005   ??? 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 Cardiac surgery  1972   ??? Insertion pe tubes  1976       Patient Care Team:  Si Gaul, MD as PCP - General (Internal Medicine)  Mardene Speak Learn, MD as Consulting Physician (Cardiovascular Disease)    Focused Physical Assessment:   Filed Vitals:    12/03/13 1435   BP: 110/64   Pulse: 68   Height: 4' 8 (1.422 m)   Weight: 134 lb (60.782 kg)     Estimated body mass index is 30.06 kg/(m^2) as calculated from the following:    Height as of this encounter: 4' 8 (1.422 m).    Weight as of this encounter: 134 lb (60.782 kg).  The patient does not have any cognitive impairment apparent during this visit.     Patient Counseling and  Personalized Prevention Plan:  See after visit summary given to patient.    ROS:   Review of Systems   Constitutional: Positive for weight gain and fatigue.   HENT: Positive for rhinorrhea and sneezing.    Respiratory: Negative for cough and shortness of breath.    Cardiovascular: Negative for chest pain and leg swelling.   Gastrointestinal: Negative for nausea and vomiting.   Genitourinary: Negative for hematuria and difficulty urinating.   Musculoskeletal: Positive for arthralgias.   Skin: Negative for rash and wound.   Neurological: Negative for seizures and syncope.   Psychiatric/Behavioral: Positive for behavioral problems and agitation. Negative for depression and dysphoric mood.          Objective:   Physical Exam   Constitutional: She is oriented to person, place, and time. She appears well-developed and well-nourished. No distress.   HENT:   Head: Normocephalic and atraumatic.   Mouth/Throat: Oropharynx is clear and moist. No oropharyngeal exudate.   Eyes: Conjunctivae are normal. No scleral icterus.   Neck: Normal range of motion. Neck supple. No  JVD present. No thyromegaly present.   Cardiovascular: Normal rate, regular rhythm and normal heart sounds.    Pulmonary/Chest: Effort normal and breath sounds normal.   Abdominal: Soft. Bowel sounds are normal. There is no tenderness.   Musculoskeletal: Normal range of motion. She exhibits no edema.   Lymphadenopathy:     She has no cervical adenopathy.   Neurological: She is alert and oriented to person, place, and time.   Skin: Skin is warm and dry. She is not diaphoretic.   Psychiatric:   Not as friendly as usual. Unwilling to listen to recommendations             Filed Vitals:    12/03/13 1435   BP: 110/64   Pulse: 68   Height: 4' 8 (1.422 m)   Weight: 134 lb (60.782 kg)     Body mass index is 30.06 kg/(m^2).  Body surface area is 1.55 meters squared.                Assessment/Plan:     Down's syndrome  Screening CBC, TSH. Refer for sleep study.      Excessive sleepiness  Screening CBC, TSH. Refer for sleep study.     Routine health maintenance  UTD with screening. Deferring pap 2/2 never sexually active and would be very traumatizing to her.     Obesity (BMI 30-39.9)  Counseled on cutting out regular soda.       Prior to the scheduled appointment, I reviewed the patient's recent laboratory values, active medical problems, and recent office visits in preparation for the visit today.

## 2013-12-03 NOTE — Unmapped (Signed)
Screening CBC, TSH. Refer for sleep study.

## 2013-12-03 NOTE — Unmapped (Signed)
Annual Wellness Visit Health Risk Assessment and Personalized Prevention Plan    Identified Risk Factors for Future Illness:  ?? None identified.     Current Status of Health and Risk Factor Screening:  Lab Results   Component Value Date    HMPAP deferred - pt never been sexually active 07/15/2011     No results found for this basename: LDL     BP Readings from Last 3 Encounters:   04/02/13 117/75   12/02/11 132/84   07/29/11 98/60       Recommended Screening Schedule for Next 3 Years:    Screening  Test When to Do It   Screening Mammography Annually starting at 50   Pelvic Exam and Pap Smear Not needed unless symptoms.   Colonoscopy Age 83   Bone Density Scan Age 56         Additional Preventive Advice:  ?? None - you are doing great!  ??

## 2013-12-04 NOTE — Unmapped (Signed)
pts mom notified

## 2013-12-04 NOTE — Unmapped (Signed)
Ryann, can you let Shantele's mom know her labs are normal? Should schedule sleep medicine doc appt.

## 2013-12-07 ENCOUNTER — Ambulatory Visit: Admit: 2013-12-07 | Discharge: 2013-12-07 | Payer: MEDICARE | Attending: Audiologist-Hearing Aid Fitter

## 2013-12-07 DIAGNOSIS — H900 Conductive hearing loss, bilateral: Secondary | ICD-10-CM

## 2013-12-10 NOTE — Unmapped (Signed)
Hearing aid check   Doing great   Wanted no changes  Follow up as needed  Signed medicaid form

## 2014-02-13 MED ORDER — pravastatin (PRAVACHOL) 10 MG tablet
10 | ORAL_TABLET | ORAL | 1.00 refills | 30.00000 days | Status: AC
Start: 2014-02-13 — End: 2015-02-14

## 2014-02-13 NOTE — Unmapped (Signed)
Last refill 10-16-13  Last OV 11-28-13 phy

## 2014-04-08 ENCOUNTER — Ambulatory Visit: Admit: 2014-04-08 | Discharge: 2014-04-08 | Payer: MEDICARE | Attending: Otology & Neurotology

## 2014-04-08 ENCOUNTER — Ambulatory Visit: Admit: 2014-04-08 | Discharge: 2014-04-08 | Payer: MEDICARE | Attending: Audiologist-Hearing Aid Fitter

## 2014-04-08 DIAGNOSIS — H6983 Other specified disorders of Eustachian tube, bilateral: Secondary | ICD-10-CM

## 2014-04-08 DIAGNOSIS — H908 Mixed conductive and sensorineural hearing loss, unspecified: Secondary | ICD-10-CM

## 2014-04-08 NOTE — Unmapped (Signed)
Physical Exam    No chief complaint on file.      Cancer Staging:  No matching staging information was found for the patient.      History of Present Illness:  Patient ID: Nicole Chen is a 43 y.o. female presents to the clinic for ear cleaning.  New hearing aids are working well. Hear for ear cleaning and annual f/u.Marland Kitchen     Pt denies otorrhea, otalgia    Patient present with mother today.  No dizziness, no headache.    Past Medical History:  She has a past medical history of Abnormal finding; Wears glasses; Hearing impairment; Cholesteatoma; Down's syndrome; Stroke; Seizures; Cholesteatoma (1976); Dysfunction of eustachian tube (05/03/2005); and Unspecified conductive hearing loss (05/03/2005).  Past Surgical History:  She has past surgical history that includes Cardiac surgery (1972) and insertion pe tubes (1976).  Family History:  Her family history includes Cancer in her mother and other; Diabetes in her maternal grandmother; Hearing loss in her other; Hypertension in her mother; Other in her cousin.  Social History:  She reports that she has never smoked. She does not have any smokeless tobacco history on file. She reports that she does not drink alcohol or use illicit drugs.    Medications:  Current Outpatient Prescriptions   Medication Sig Dispense Refill   ??? aspirin 81 MG chewable tablet Chew 81 mg by mouth daily.     ??? multivitamin (MULTIPLE VITAMINS) tablet      ??? pravastatin (PRAVACHOL) 10 MG tablet TAKE 1 TABLET BY MOUTH DAILY 90 tablet 3     No current facility-administered medications for this visit.       Allergies:  Review of patient's allergies indicates no known allergies.    Physical Exam:  General:  Well-developed, well-nourished female in no acute distress.  I was not able to converse well with the patient.  The patient is not able to answer questions adequately and appropriately. Mother is main historian.  Psychiatric:  NL mood and affect.  A+O x 4.  Head and Face: AT-NC.   Eye:  Ocular motility  NL.  No erythema or infection.  Skin:  Visual examination and palpation of the scalp, neck, and head reveals no significant rashes, ulcerations, nodules, or lesions.  Ears:  External ears have a normal appearance with no masses, lesions, or scars.  Tuning forks not reliable.  Cardiovascular:  Warm and well perfused.  Neurologic:  Facial nerve function grade I.  Other cranial nerves grossly WNL.     Procedure Note:  Otomicroscopic exam was done of both EAC's:  Right: EAC normal, mild cerumen removed with loops; TM with post-surgical graft changes, hypomobile, and light reflex is present in anterior inferior quadrant; middle ear clear  Left: EAC normal, mild cerumen removed with loops; TM with mild retraction pocket of pars tensa without debris, myringosclerosis, mobile, and light reflex is present in anterior inferior quadrant; middle ear clear    Assessment & Plan:  Dear Dr. Si Gaul,  43 y.o. 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  PMHx: Trisomy 64, PE tubes AU, tympanoplasty with mastoidectomy CWR AD in 2006 for cholesteatoma  - Audiogram reviewed with pt and mom: mixed hearing loss AU  - Continue with hearing aids AU.  - Use hydrogen peroxide, acetic acid, mineral/olive/ sweet tree oil, or Debrox solution to ears for prevention of wax accumulation. Avoid Q-tip and bobby pin use.  - Options for PE tubes in future. After discussing with  mother, will hold for now.  - RTC in 1 year with audiogram  Recommend diet and exercise for weight loss (BMI 28)  Note: If you have not done so already, please sign up for My Chart Health Portal at [email or web address] using the number provided on your After Visit Summary. It is an excellent way to communicate with the office as well as review your personal health records and testing results.    I have seen and examined the patient. I have discussed with the PA and agree with the findings and plan as documented in the note.     Medical  history questionnaire was reviewed by me with the patient. Review of systems is negative except as noted in the HPI and the scanned document dated from today.   Medical Decision Making:   * review/order other diagnostic or tx interventions   * discussed case with another provider (letter sent)   * Hearing Test Reviewed: yes   Patient Education:   * verbal information and/or pamphlet/ literature given to patient.   Risks & Benefits:   * Risks, benefits and treatment options discussed with patient.

## 2014-04-08 NOTE — Unmapped (Signed)
Dear Dr. Si Gaul,  43 y.o. 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  PMHx: Trisomy 73, PE tubes AU, tympanoplasty with mastoidectomy CWR AD in 2006 for cholesteatoma  - Audiogram reviewed with pt and mom: mixed hearing loss AU  - Continue with hearing aids AU.  - Use hydrogen peroxide, acetic acid, mineral/olive/ sweet tree oil, or Debrox solution to ears for prevention of wax accumulation. Avoid Q-tip and bobby pin use.  - Options for PE tubes in future. After discussing with mother, will hold for now.  - RTC in 1 year with audiogram  Recommend diet and exercise for weight loss (BMI 28)  Note: If you have not done so already, please sign up for My Chart Health Portal at [email or web address] using the number provided on your After Visit Summary. It is an excellent way to communicate with the office as well as review your personal health records and testing results.

## 2014-04-08 NOTE — Unmapped (Signed)
See attached audiogram

## 2014-09-04 NOTE — Unmapped (Signed)
Did she have chicken pox as a child?

## 2014-09-04 NOTE — Unmapped (Signed)
I called pts mtr Renea Ee, pts mtr doesn't remember dtr getting the chicken pox as a child

## 2014-09-04 NOTE — Unmapped (Signed)
Pt's mom called.   Patient works at [employer], And has been exposed to chicken pox.   Mom asking if she should get the vaccine,  Or pls advise.

## 2014-09-04 NOTE — Unmapped (Signed)
I called the pts mtr and gave her info  Order for lab at desk  pts mtr is stating she doesn't want to get the labs at this time. dtr gets upset when she has to labs done  Will hold off on labs for now

## 2014-09-04 NOTE — Unmapped (Signed)
If born before 1980, pts are considered immune. If she would like to check her immunized status, we can. Ordered blood test.

## 2015-02-12 NOTE — Unmapped (Signed)
Pt's mom called and sched her phys for 10/31, and is asking if you can call this RF in till then.    She can be reached at [phone].

## 2015-02-14 MED ORDER — pravastatin (PRAVACHOL) 10 MG tablet
10 | ORAL_TABLET | Freq: Every day | ORAL | 1.00 refills | 30.00000 days | Status: AC
Start: 2015-02-14 — End: 2015-05-13

## 2015-02-14 NOTE — Unmapped (Signed)
Pt's mom called back and is asking if you can Pls call this med in- pt has an appt 10/31 for her phys.

## 2015-02-14 NOTE — Unmapped (Signed)
Addended by: Darliss Ridgel on: 02/14/2015 09:20 AM     Modules accepted: Orders

## 2015-02-22 NOTE — Unmapped (Addendum)
Annual Wellness Visit Health Risk Assessment and Personalized Prevention Plan    Identified Risk Factors for Future Illness:  ?? Vaccines: get Tdap at pharmacy.   ?? Abdominal distention: get ultrasound. Cut down on calories (sugary beverages) and increase exercise.     Current Status of Health and Risk Factor Screening:  Lab Results   Component Value Date    HMPAP deferred - pt never been sexually active 07/15/2011     Lab Results   Component Value Date    LDL 58 12/03/2013     BP Readings from Last 3 Encounters:   04/08/14 135/68   12/03/13 110/64   04/02/13 117/75       Recommended Screening Schedule for Next 3 Years:    Screening  Test When to Do It   Screening Mammography Annually starting at 45 years old   Pelvic Exam and Pap Smear Not needed unless symptoms.   Colonoscopy At 45 years old   Bone Density Scan At 45 years old         Additional Preventive Advice:  ?? None - you are doing great!  ??

## 2015-02-22 NOTE — Unmapped (Signed)
Subjective  HPI:   Patient ID: Nicole Chen is a 44 y.o. female.    Chief Complaint: physical  HPI       44 y/o F with h/o down's syndrome, CVA suspect embolic of unknown etiology and overweight here for physical.    Mom retired last year. Pt goes to [employer] during the day - works about 6 hours per day. Bowls, does ballroom dancing, goes to camp.     CVA: on ASA, pravastatin.   BP Readings from Last 3 Encounters:   02/24/15 115/70   04/08/14 135/68   12/03/13 110/64     Overweight: mom feels like she is having abdominal distention - really focusing on trimming down the diet, but having a lot of abdominal weight. Less periods recently perhaps - pt has hard time determining.   Wt Readings from Last 5 Encounters:   02/24/15 128 lb (58.06 kg)   04/08/14 127 lb (57.607 kg)   12/03/13 134 lb (60.782 kg)   10/18/13 132 lb (59.875 kg)   10/10/13 127 lb (57.607 kg)     Down's syndrome: Due for screening. Having snoring, but mom refuses sleep study b/c of inability to go and have done. Was not open to scheduling suggestions.     HM: declines paps. Due for fluvax - declines    Annual Wellness Exam and Personalized Prevention Plan    Nicole Chen is a 44 y.o. woman here today for an initial Annual Wellness Exam.    Health Risk Assessment and Functional Status Review: Questionnaire completed by patient and I reviewed it and we discussed the following issues:  ?? None identified.     Patient's Medical and Surgical History:      Past Medical History   Diagnosis Date   ??? Abnormal finding      ent problems   ??? Wears glasses    ??? Hearing impairment    ??? Cholesteatoma    ??? Down's syndrome    ??? Stroke    ??? Seizures    ??? Cholesteatoma 1976     Rt ear s/p resection   ??? Dysfunction of eustachian tube 05/03/2005   ??? 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 Cardiac surgery  1972   ??? Insertion pe tubes  1976       Patient Care Team:  Si Gaul, MD as PCP - General  (Internal Medicine)  Mardene Speak Learn, MD as Consulting Physician (Cardiovascular Disease)  Chauncey Cruel, MD as Consulting Physician (Otolaryngology)    Focused Physical Assessment:   Filed Vitals:    02/24/15 1013   BP: 115/70   Pulse: 78   Height: 4' 8 (1.422 m)   Weight: 128 lb (58.06 kg)     Estimated body mass index is 28.71 kg/(m^2) as calculated from the following:    Height as of this encounter: 4' 8 (1.422 m).    Weight as of this encounter: 128 lb (58.06 kg).  The patient does not have any cognitive impairment apparent during this visit.     Patient Counseling and Personalized Prevention Plan:  See after visit summary given to patient.    ROS:   Review of Systems   Constitutional: Negative for fever and chills.   HENT: Negative for congestion and rhinorrhea.    Eyes: Negative for redness and itching.   Respiratory: Negative for cough and shortness of breath.  Cardiovascular: Negative for chest pain and leg swelling.   Gastrointestinal: Negative for nausea, abdominal pain and constipation.   Genitourinary: Negative for dysuria and difficulty urinating.   Musculoskeletal: Negative for myalgias and arthralgias.   Skin: Negative for rash and wound.   Neurological: Negative for dizziness and light-headedness.   Psychiatric/Behavioral: Positive for sleep disturbance (snores).          Objective:   Physical Exam   Constitutional: She is oriented to person, place, and time. She appears well-developed and well-nourished. No distress.   HENT:   Head: Normocephalic and atraumatic.   Mouth/Throat: Oropharynx is clear and moist. No oropharyngeal exudate.   Eyes: Conjunctivae are normal. No scleral icterus.   Neck: Normal range of motion. Neck supple. No JVD present. No thyromegaly present.   Cardiovascular: Normal rate, regular rhythm and normal heart sounds.    Pulmonary/Chest: Effort normal and breath sounds normal.   Abdominal: Soft. Bowel sounds are normal. There is no tenderness.   Genitourinary:   Breast  exam normal   Musculoskeletal: Normal range of motion. She exhibits no edema.   Lymphadenopathy:     She has no cervical adenopathy.   Neurological: She is alert and oriented to person, place, and time.   Skin: Skin is warm and dry. She is not diaphoretic.   Psychiatric: She has a normal mood and affect. Her behavior is normal.             Filed Vitals:    02/24/15 1013   BP: 115/70   Pulse: 78   Height: 4' 8 (1.422 m)   Weight: 128 lb (58.06 kg)     Body mass index is 28.71 kg/(m^2).  Body surface area is 1.51 meters squared.                Assessment/Plan:     Routine health maintenance  Discussed mammograms and mom would like to wait until age 33. PHQ-2 negative, EtOH screen negative. Labs today. Declines fluvax.     Conductive hearing loss  Doing well with hearing aids    Down's syndrome  Check TSH, CBC. Declines OSA screening b/c unable to stay overnight. Long discussion regarding plan for when mother passes. Has house in trust and sister who is committed to caring for pt.     HLD (hyperlipidemia)  Lipids today.       Prior to the scheduled appointment, I reviewed the patient's recent laboratory values, active medical problems, and recent office visits in preparation for the visit today.     The patient understands his/her medication(s).  Side effects/barriers of these medications were addressed.  Over-the-counter medications were also addressed during this encounter.

## 2015-02-24 ENCOUNTER — Other Ambulatory Visit: Admit: 2015-02-24 | Payer: MEDICARE

## 2015-02-24 ENCOUNTER — Ambulatory Visit: Admit: 2015-02-24 | Discharge: 2015-02-24 | Payer: MEDICARE

## 2015-02-24 DIAGNOSIS — Z0000 Encounter for general adult medical examination without abnormal findings: Secondary | ICD-10-CM

## 2015-02-24 DIAGNOSIS — Q909 Down syndrome, unspecified: Secondary | ICD-10-CM

## 2015-02-24 LAB — CBC
Hematocrit: 42.6 % (ref 35.0–45.0)
Hemoglobin: 14.5 g/dL (ref 11.7–15.5)
MCH: 34 pg — ABNORMAL HIGH (ref 27.0–33.0)
MCHC: 34.1 g/dL (ref 32.0–36.0)
MCV: 99.5 fL (ref 80.0–100.0)
MPV: 7.4 fL — ABNORMAL LOW (ref 7.5–11.5)
Platelets: 307 10E3/uL (ref 140–400)
RBC: 4.28 10E6/uL (ref 3.80–5.10)
RDW: 13.1 % (ref 11.0–15.0)
WBC: 5.3 10E3/uL (ref 3.8–10.8)

## 2015-02-24 LAB — RENAL FUNCTION PANEL W/EGFR
Albumin: 4.1 g/dL (ref 3.5–5.7)
Anion Gap: 10 mmol/L (ref 3–16)
BUN: 20 mg/dL (ref 7–25)
CO2: 25 mmol/L (ref 21–33)
Calcium: 9 mg/dL (ref 8.6–10.3)
Chloride: 104 mmol/L (ref 98–110)
Creatinine: 0.7 mg/dL (ref 0.60–1.30)
Glucose: 86 mg/dL (ref 70–100)
Osmolality, Calculated: 290 mOsm/kg (ref 278–305)
Phosphorus: 3.3 mg/dL (ref 2.1–4.7)
Potassium: 4.1 mmol/L (ref 3.5–5.3)
Sodium: 139 mmol/L (ref 133–146)
eGFR AA CKD-EPI: >90 See note.
eGFR NONAA CKD-EPI: >90 See note.

## 2015-02-24 LAB — LIPID PANEL
Cholesterol, Total: 152 mg/dL (ref 0–200)
HDL: 62 mg/dL (ref 60–92)
LDL Cholesterol: 73 mg/dL
Triglycerides: 86 mg/dL (ref 10–149)

## 2015-02-24 LAB — THYROID FUNCTION CASCADE: TSH: 1.13 u[IU]/mL (ref 0.34–5.60)

## 2015-02-24 LAB — HEMOGLOBIN A1C: Hemoglobin A1C: 5.1 % (ref 4.8–6.4)

## 2015-02-24 MED ORDER — pravastatin (PRAVACHOL) 10 MG tablet
10 | ORAL_TABLET | Freq: Every day | ORAL | 1.00 refills | 30.00000 days | Status: AC
Start: 2015-02-24 — End: 2015-05-13

## 2015-02-24 NOTE — Unmapped (Signed)
Discussed mammograms and mom would like to wait until age 44. PHQ-2 negative, EtOH screen negative. Labs today. Declines fluvax.

## 2015-02-24 NOTE — Unmapped (Signed)
Doing well with hearing aids

## 2015-02-24 NOTE — Unmapped (Addendum)
Check TSH, CBC. Declines OSA screening b/c unable to stay overnight. Long discussion regarding plan for when mother passes. Has house in trust and sister who is committed to caring for pt.

## 2015-02-24 NOTE — Unmapped (Signed)
Lipids today.

## 2015-02-24 NOTE — Unmapped (Signed)
Nicole Chen, can you let Evelynn know that Estha's labs all normal. Thanks!

## 2015-02-25 NOTE — Unmapped (Signed)
Called patients POA lmom with details

## 2015-02-28 ENCOUNTER — Inpatient Hospital Stay: Admit: 2015-02-28 | Payer: MEDICARE

## 2015-02-28 DIAGNOSIS — K7689 Other specified diseases of liver: Secondary | ICD-10-CM

## 2015-03-05 NOTE — Unmapped (Signed)
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 No ascites. Needs w/u. Left VM - try again later.

## 2015-03-06 NOTE — Unmapped (Signed)
U/s with hepatocellular disease - needs w/u with more labs. She will get done

## 2015-03-06 NOTE — Unmapped (Signed)
Opened in error

## 2015-03-06 NOTE — Unmapped (Signed)
Renea Ee mother called back [phone] to speak to Dr Nelta Numbers on results  Home today til 4:30 or tomorrow til 11:30

## 2015-03-11 ENCOUNTER — Other Ambulatory Visit: Admit: 2015-03-11 | Payer: MEDICARE

## 2015-03-11 DIAGNOSIS — K769 Liver disease, unspecified: Secondary | ICD-10-CM

## 2015-03-11 LAB — IRON STUDIES
% Iron Saturation: 32.8 % (ref 15.0–55.0)
Iron: 120 ug/dL (ref 50–212)
TIBC: 366 ug/dL (ref 265–497)

## 2015-03-11 LAB — HEPATIC FUNCTION PANEL
ALT: 26 U/L (ref 7–52)
AST: 22 U/L (ref 13–39)
Albumin: 4.2 g/dL (ref 3.5–5.7)
Alkaline Phosphatase: 60 U/L (ref 36–125)
Bilirubin, Direct: 0.16 mg/dL (ref 0.00–0.40)
Bilirubin, Indirect: 0.74 mg/dL (ref 0.00–1.10)
Total Bilirubin: 0.9 mg/dL (ref 0.0–1.5)
Total Protein: 6.8 g/dL (ref 6.4–8.9)

## 2015-03-11 LAB — ALPHA-1-ANTITRYPSIN: A-1 Antitrypsin: 138 mg/dL (ref 84.0–218.0)

## 2015-03-11 LAB — HEPATITIS C ANTIBODY
HCV Ab: NONREACTIVE
HCVAB Number: 0.14 S/CO (ref 0.00–0.79)

## 2015-03-11 LAB — FERRITIN: Ferritin: 60.9 ng/mL (ref 11.0–306.8)

## 2015-03-11 LAB — PROTIME-INR
INR: 1.1 (ref 0.9–1.1)
Protime: 14 seconds (ref 11.6–14.4)

## 2015-03-11 LAB — HEPATITIS B SURFACE ANTIBODY, QUANTITATIVE
HBSAB NUMBER: <8 m[IU]/mL (ref 0.00–7.99)
Hep B S Ab: NONREACTIVE

## 2015-03-11 LAB — HEPATITIS B SURFACE ANTIGEN: Hep B Surface Ag: NONREACTIVE

## 2015-03-11 LAB — HEPATITIS A ANTIBODY TOTAL: Hep A IgG: NONREACTIVE

## 2015-03-13 NOTE — Unmapped (Signed)
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 Discussed options and will repeat U/S in 6 months and w/u on weight loss. Try to minimize blood tests as pt is terrified of needles.

## 2015-03-13 NOTE — Unmapped (Signed)
Called pt to discuss. Normal labs.    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 Discussed options and will repeat U/S in 6 months and w/u on weight loss. Try to minimize blood tests as pt is terrified of needles.

## 2015-05-13 MED ORDER — pravastatin (PRAVACHOL) 10 MG tablet
10 | ORAL_TABLET | ORAL | 1.00 refills | 30.00000 days | Status: AC
Start: 2015-05-13 — End: 2016-05-03

## 2015-09-12 NOTE — Unmapped (Signed)
Pt;s Mom called.  She is wanting to know how she can proceed w/ getting Guardianship for patient --She has down syndrome.  Does she need to make an OV w/ you, Or pls advise.

## 2015-09-12 NOTE — Unmapped (Signed)
Letter completed.

## 2015-09-12 NOTE — Unmapped (Signed)
You can mail her a guide to guardianship in [place].

## 2015-09-12 NOTE — Unmapped (Signed)
Letter has been mailed.

## 2015-09-12 NOTE — Unmapped (Signed)
She needs a letter stating that she needs guardianship due to iq of being 49, having downs syndrome and not being able to take care of her self. She can not read, write, or drive.   She states she needs a form, but i told her the best we can do it put something on a letter head.   But she does want the info mailed out.

## 2015-09-18 ENCOUNTER — Ambulatory Visit: Admit: 2015-09-18 | Payer: MEDICARE | Attending: Critical Care Medicine

## 2015-09-18 DIAGNOSIS — I639 Cerebral infarction, unspecified: Secondary | ICD-10-CM

## 2015-09-18 NOTE — Unmapped (Signed)
Subjective:      Patient ID: Nicole Chen is a 45 y.o. female.    HPI:  This 45 year old woman with a history of Down syndrome, ventricular septal defect repair, hearing loss, left MCA stroke, was last seen in June of 2015.  Nicole Chen has not had further ischemic events on aspirin.  Her mother brought her in today for assistance with obtaining guardianship of the patient.  Apparently there has never been a formal guardianship established and the mother is planning for the future in terms of financial and other matters given the mother's age.    The mother asked me to document the patient's mental status and fill out a guardianship form.  My understanding is that this is based primarily on the patients decision making capacity.  She has Down syndrome, which is often associated with static cognitive impairment at birth, but as patients with Down syndrome age, they can also developed Alzheimer's disease.       Histories:     She has a past medical history of Abnormal finding; Wears glasses; Hearing impairment; Cholesteatoma; Down's syndrome; Stroke; Seizures; Cholesteatoma (1976); Dysfunction of eustachian tube (05/03/2005); and Unspecified conductive hearing loss (05/03/2005).    She has past surgical history that includes Cardiac surgery (1972) and insertion pe tubes (1976).    Her family history includes Cancer in her mother and other; Diabetes in her maternal grandmother; Hearing loss in her other; Hypertension in her mother; Other in her cousin.    She reports that she has never smoked. She does not have any smokeless tobacco history on file. She reports that she does not drink alcohol or use illicit drugs.      Review of Systems    Allergies:   Review of patient's allergies indicates no known allergies.    Medications:     Outpatient Encounter Prescriptions as of 09/18/2015   Medication Sig Dispense Refill   ??? aspirin 81 MG chewable tablet Chew 81 mg by mouth daily.     ??? multivitamin (MULTIPLE VITAMINS) tablet       ??? pravastatin (PRAVACHOL) 10 MG tablet TAKE 1 TABLET(10 MG) BY MOUTH DAILY 90 tablet 3     No facility-administered encounter medications on file as of 09/18/2015.        Objective:       Blood pressure 122/78, pulse 64, height 4' 8 (1.422 m), weight 131 lb (59.421 kg).    Neurologic Exam:  For today's exam, I asked the patient the date and she was unable to come up with the day of the week or the month or the year.  I asked her to say the days of the week in forward order and she did a couple of them, but then was unable to complete the task.  She told me the street she lived on, but could not name her house number.  She knew her age.  She could not read words.  She had trouble repeating words and phrases for me.  I showed her pictures and she was able to name some of the objects, but she often made paraphasic errors.  She has a baseline dysarthria and her speech is notable for diminished fluency with a grammatical quality.    I tried simple math and she could not add 2 + 3.  I asked her what she would have left if I gave her a dollar and then she had to pay me 50 cents and she was unable to answer the  question.  I asked her about her various medical issues including what she was hospitalized for at [facility].  She was unable to describe anything about her heart disease or stroke or any of her medical issues.  She was unable to recall items.  She could not answer any complex question or abstract question.  She had no pronator drift per se and she was able to shake my hand.  Physical Exam    Prior Diagnostic Testing:        Assessment:   Nicole Chen has a history of Down syndrome, surgery for a ventricular septal defect, and a left MCA stroke.  She has been fairly stable recently.  However, she has substantial baseline cognitive impairments.  She obviously has difficulty with orientation, language, reading, recall, attention, judgment, and reasoning.  Her family has to help her with many activities of daily  living.  In my opinion, she does not possess decision making capacity for financial or medical matters based on the foregoing.  It is also likely that her cognitive issues will worsen as she ages due to the Down syndrome and associated Alzheimer's disease that accompanies it.      Plan:       Time Spent With Patient:   Time spent with patient was 45 minutes  Time spent discussing diagnosis, management and treatment plan was >25 Minutes.      DK/nwc  [id number]

## 2015-09-24 NOTE — Unmapped (Signed)
Mother called back, court requires either hand-signed original letter attached to original 'statement of expert evaluation', [facility], or original form with corrected(lined through and initialed with correct date) date of 09/18/2015(mother states error was in two places on form).  Requests that it be mailed to her home, address verified.

## 2015-09-24 NOTE — Unmapped (Signed)
I don't fully understand what is needed here. I won't be back in the office until next week.

## 2015-09-24 NOTE — Unmapped (Signed)
Spoke with Nicole Chen the pts mother.  On the form Dr. Mary Sella wrote the date as 57 and not 2017.   It will need to be crossed out and intitaled and the correct date added ( 2 spots).  Instructed the Nicole Chen that she could bring the form to the office and that I could take care of this for her.  I would also like to make a copy to have scanned in her chart.  She will try to come on Friday.

## 2015-09-24 NOTE — Unmapped (Signed)
Mother called, states on application for guardianship for pt, Dr Mary Sella inadvertently wrote 09/18/1995 on form when pt in office.  She will check with court to see if they will accept a revision letter or a faxed corrected form and call us back.

## 2015-09-26 NOTE — Unmapped (Signed)
Noted.

## 2015-09-26 NOTE — Unmapped (Signed)
Pt called and she would like to come after lunch maybe 130, pls advise

## 2016-01-12 ENCOUNTER — Ambulatory Visit: Admit: 2016-01-12 | Discharge: 2016-01-12 | Payer: MEDICARE

## 2016-01-12 ENCOUNTER — Inpatient Hospital Stay: Admit: 2016-01-12 | Payer: MEDICARE

## 2016-01-12 ENCOUNTER — Other Ambulatory Visit: Admit: 2016-01-12 | Payer: MEDICARE

## 2016-01-12 DIAGNOSIS — R112 Nausea with vomiting, unspecified: Secondary | ICD-10-CM

## 2016-01-12 LAB — COMPREHENSIVE METABOLIC PANEL
ALT: 30 U/L (ref 7–52)
AST: 23 U/L (ref 13–39)
Albumin: 4.5 g/dL (ref 3.5–5.7)
Alkaline Phosphatase: 66 U/L (ref 36–125)
Anion Gap: 11 mmol/L (ref 3–16)
BUN: 22 mg/dL (ref 7–25)
CO2: 27 mmol/L (ref 21–33)
Calcium: 8.9 mg/dL (ref 8.6–10.3)
Chloride: 104 mmol/L (ref 98–110)
Creatinine: 0.72 mg/dL (ref 0.60–1.30)
Glucose: 101 mg/dL (ref 70–100)
Osmolality, Calculated: 297 mOsm/kg (ref 278–305)
Potassium: 4 mmol/L (ref 3.5–5.3)
Sodium: 142 mmol/L (ref 133–146)
Total Bilirubin: 0.6 mg/dL (ref 0.0–1.5)
Total Protein: 7.4 g/dL (ref 6.4–8.9)
eGFR AA CKD-EPI: >90 See note.
eGFR NONAA CKD-EPI: >90 See note.

## 2016-01-12 LAB — IGA: IgA: 305 mg/dL (ref 82.0–453.0)

## 2016-01-12 LAB — CBC
Hematocrit: 44.9 % (ref 35.0–45.0)
Hemoglobin: 15.7 g/dL — ABNORMAL HIGH (ref 11.7–15.5)
MCH: 34.8 pg — ABNORMAL HIGH (ref 27.0–33.0)
MCHC: 35.1 g/dL (ref 32.0–36.0)
MCV: 99.3 fL (ref 80.0–100.0)
MPV: 7.2 fL — ABNORMAL LOW (ref 7.5–11.5)
Platelets: 316 10E3/uL (ref 140–400)
RBC: 4.52 10E6/uL (ref 3.80–5.10)
RDW: 13.3 % (ref 11.0–15.0)
WBC: 5.8 10E3/uL (ref 3.8–10.8)

## 2016-01-12 LAB — THYROID FUNCTION CASCADE: TSH: 2.2 u[IU]/mL (ref 0.34–5.60)

## 2016-01-12 LAB — TISSUE TRANSGLUTAMINASE, IGA: Transglutaminase IgA: 3 U/mL (ref 0–3)

## 2016-01-12 LAB — HEMOGLOBIN A1C: Hemoglobin A1C: 5 % (ref 4.8–6.4)

## 2016-01-12 NOTE — Assessment & Plan Note (Signed)
Unclear etiology. Obtain KUB, celiac studies (esp given down's), h. Pylori, CMP, TSH. If all negative, consider referral for EGD. [place] negative other than fatty liver last year.

## 2016-01-12 NOTE — Progress Notes (Signed)
Progress Note    Chief Complaint:  Chief Complaint   Patient presents with    Nausea     vomiting. 4 times in 5 wks. not losing weight.     Bloated       History of Present Illness:  45 y.o. female with history of Down's syndrome, HLD, CVA, obesity here for nausea.     Nausea: having 4 bouts of emesis over last 5 weeks. Denies feeling sick, just randomly having emesis. Mom hasn't seen, Jameila just reports. No abdominal pain. Reports normal daily BMs without blood. Emesis without blood though pt is not reliable history giver b/c of downs.   Lab Results   Component Value Date    TSH 1.13 02/24/2015     Obesity: mom worried about weight gain. Drinking a lot of juices - apple, orange.   Wt Readings from Last 10 Encounters:   01/12/16 133 lb 3.2 oz (60.4 kg)   09/18/15 131 lb (59.4 kg)   02/24/15 128 lb (58.1 kg)   04/08/14 127 lb (57.6 kg)   12/03/13 134 lb (60.8 kg)   10/18/13 132 lb (59.9 kg)   10/10/13 127 lb (57.6 kg)   04/02/13 127 lb (57.6 kg)   09/25/12 127 lb 3.2 oz (57.7 kg)   04/24/12 127 lb 12.8 oz (58 kg)         Patient Active Problem List   Diagnosis    Cholesteatoma    Down's syndrome    HLD (hyperlipidemia)    CVA (cerebral vascular accident)    Seizure    Routine health maintenance    Conductive hearing loss    Excessive sleepiness    Obesity (BMI 30-39.9)    Abnormal liver diagnostic imaging    Nausea and vomiting in adult    Abnormal weight gain        HM: Due for fluvax -declines.   Health Maintenance Due   Topic Date Due    HIV Screening  09/26/1987    Depression Screening  09/26/1987    Immunization: DTaP/Tdap/Td (8 - Td) 07/14/2013    Cervical Cancer Screening/Pap Smear  07/15/2014    Immunization: Influenza (1) 11/25/2015       Current Outpatient Prescriptions on File Prior to Visit   Medication Sig Dispense Refill    aspirin 81 MG chewable tablet Chew 81 mg by mouth daily.      multivitamin (MULTIPLE VITAMINS) tablet       pravastatin (PRAVACHOL) 10 MG tablet TAKE 1  TABLET(10 MG) BY MOUTH DAILY 90 tablet 3     No current facility-administered medications on file prior to visit.        Review of systems:  Review of Systems   Constitutional: Positive for weight gain. Negative for chills and fever.   Respiratory: Negative for cough and shortness of breath.    Cardiovascular: Negative for chest pain and leg swelling.   Gastrointestinal: Positive for abdominal distention and vomiting. Negative for blood in stool, constipation, diarrhea and nausea.   Skin: Negative for rash and wound.   Psychiatric/Behavioral: Negative for sleep disturbance.       Physical Exam:  Physical Exam   Constitutional: She appears well-developed and well-nourished. No distress.   HENT:   Head: Normocephalic and atraumatic.   Mouth/Throat: No oropharyngeal exudate.   Pulmonary/Chest: Effort normal. No respiratory distress.   Abdominal: Soft. Bowel sounds are normal. She exhibits no distension and no mass. There is no tenderness. There is no rebound and no  guarding. No hernia.   Musculoskeletal: Normal range of motion. She exhibits no edema.   Neurological: She is alert.   Skin: Skin is warm and dry. She is not diaphoretic.       Vitals:    01/12/16 1443   BP: 120/52   BP Location: Right arm   Patient Position: Sitting   BP Cuff Size: Small   Pulse: 57   SpO2: 99%   Weight: 133 lb 3.2 oz (60.4 kg)       Assessment and Plan:    Abnormal weight gain   Suspect related to dietary issues. Counseled to stop drinking juice as likely contributing. Discussed dietician. Will consider.     Nausea and vomiting in adult  Unclear etiology. Obtain KUB, celiac studies (esp given down's), h. Pylori, CMP, TSH. If all negative, consider referral for EGD. [place] negative other than fatty liver last year.       Prior to the scheduled appointment, I reviewed the patient's recent laboratory values, active medical problems, and recent office visits in preparation for the visit today.     The patient understands his/her medication(s).   Side effects/barriers of these medications were addressed.  Over-the-counter medications were also addressed during this encounter.

## 2016-01-12 NOTE — Assessment & Plan Note (Signed)
Suspect related to dietary issues. Counseled to stop drinking juice as likely contributing. Discussed dietician. Will consider.

## 2016-01-12 NOTE — Patient Instructions (Signed)
Have x-ray done today.     Bring in stool sample to [facility] [facility]. [facility] [address] in [place].     Blood work today.

## 2016-01-14 NOTE — Unmapped (Signed)
-----   Message from Si Gaul, MD sent at 01/14/2016  4:26 PM EDT -----  Labs and x-ray all normal. Would suggest GI referral as next step as pt may need a scope. Referral placed.

## 2016-01-15 NOTE — Telephone Encounter (Signed)
Spoke to mother, she wants dr Nelta Numbers to call her, she was not happy that a scope is needed.

## 2016-01-16 NOTE — Telephone Encounter (Signed)
Spoke to Coleman - advised that another doc would have to do the procedure and I would like her to see them to decide if EGD is needed or another test instead.    Agreeable to this.

## 2016-02-09 NOTE — Telephone Encounter (Signed)
Called patient and spoke with mother as patient as down syndrome per mother and attempted to schedule patient appointment with Dr. Eyvonne Left as a new patient for nausea and vomiting.  Mother states patient is fine now, mother declined appointment at this time and stated she will call back if patient has any more nausea and vomiting.  All questions answered.

## 2016-05-03 MED ORDER — pravastatin (PRAVACHOL) 10 MG tablet
10 | ORAL_TABLET | ORAL | 0 refills | Status: AC
Start: 2016-05-03 — End: 2016-07-30

## 2016-05-03 NOTE — Unmapped (Signed)
Last refill 05-13-15  Last OV 01-12-16  Last phy 02-24-15  NEEDS PHYSICAL PRIOR TO NEXT REFILL

## 2016-07-30 MED ORDER — pravastatin (PRAVACHOL) 10 MG tablet
10 | ORAL_TABLET | ORAL | 0 refills | Status: AC
Start: 2016-07-30 — End: 2016-11-05

## 2016-07-30 NOTE — Unmapped (Signed)
Last Check up/Annual PE: 02-24-15 (other ov was acute)  Labs: 01-12-16  Next OV: none

## 2016-08-17 NOTE — Telephone Encounter (Signed)
Pt has not seen RNS since 2015, she was scheduled as EST and w/o Audio on this coming Monday 4/30. Due to dbl booked schedule. Called pts' mother, re-scheduled pt to 09/27/16 @ 12:30pm - Audio, 1:45pm - [facility] @ [facility]. She verbalized understanding and confirmed.

## 2016-08-23 ENCOUNTER — Encounter: Payer: MEDICARE | Attending: Otology & Neurotology

## 2016-09-10 NOTE — Unmapped (Addendum)
Pt needs physical for [employer] and is req appt on June 4. Pt has other appts in building on June 4 and because mother brings pt to her appts  and she has mobility issues she is wanting to have all appts on same day.     If Dr Nelta Numbers can't see her, is it ok to sch phy with partner.?

## 2016-09-10 NOTE — Telephone Encounter (Signed)
11:30 that day

## 2016-09-10 NOTE — Telephone Encounter (Signed)
LM for Nicole Chen.   Scheduled appt for pt at 1130 on 6/4.   Asked for a call back.

## 2016-09-13 NOTE — Unmapped (Signed)
Spoke to Nicole Chen, appt is ok for her and pt.

## 2016-09-23 NOTE — Unmapped (Signed)
Referral placed

## 2016-09-23 NOTE — Unmapped (Signed)
Pt has appt schd on Monday June 4 with [facility] for hearing test and needs referral.  Pls call pt's mother when compl.

## 2016-09-23 NOTE — Unmapped (Signed)
Evelyn notified.

## 2016-09-27 ENCOUNTER — Ambulatory Visit: Admit: 2016-09-27 | Discharge: 2016-09-27 | Payer: MEDICARE | Attending: Otology & Neurotology

## 2016-09-27 ENCOUNTER — Ambulatory Visit: Admit: 2016-09-27 | Discharge: 2016-09-27 | Payer: MEDICARE

## 2016-09-27 ENCOUNTER — Ambulatory Visit: Admit: 2016-09-27 | Discharge: 2016-09-27 | Payer: MEDICARE | Attending: Audiologist-Hearing Aid Fitter

## 2016-09-27 DIAGNOSIS — H9190 Unspecified hearing loss, unspecified ear: Secondary | ICD-10-CM

## 2016-09-27 DIAGNOSIS — H7191 Unspecified cholesteatoma, right ear: Secondary | ICD-10-CM

## 2016-09-27 DIAGNOSIS — E785 Hyperlipidemia, unspecified: Secondary | ICD-10-CM

## 2016-09-27 NOTE — Unmapped (Signed)
Progress Note    Chief Complaint:  Chief Complaint   Patient presents with   ??? Annual Exam     physical for [employer]ics.        History of Present Illness:  46 y.o. female with history of [name], CVA c/b seizure a few years ago, overweight here for physical.     Problem   Hld (Hyperlipidemia)    On pravastatin 10  09/26/2016:   Lab Results   Component Value Date    CHOLTOT 152 02/24/2015    TRIG 86 02/24/2015    HDL 62 02/24/2015    LDL 73 02/24/2015    LDLCALC 76 02/02/2011          Down's Syndrome    Lives with mom who is guardian.  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 Currently bowling weekly without difficulty. Complete physical done for this. Ok to proceed without further w/u.      Overweight (Bmi 25.0-29.9)    09/27/2016:   Wt Readings from Last 3 Encounters:   09/27/16 129 lb 3.2 oz (58.6 kg)   01/12/16 133 lb 3.2 oz (60.4 kg)   09/18/15 131 lb (59.4 kg)          Routine Health Maintenance   Nausea and Vomiting in Adult    09/27/2016: was having in the fall. Labs all normal. Doing fine - stopped all together.      Abnormal Liver Diagnostic Imaging    Seen on imaging. Labs all normal previously  09/26/2016: due for LFTs  Lab Results   Component Value Date    ALT 30 01/12/2016    AST 23 01/12/2016    ALKPHOS 66 01/12/2016    BILITOT 0.6 01/12/2016     =         Patient Active Problem List   Diagnosis   ??? Cholesteatoma   ??? Down's syndrome   ??? HLD (hyperlipidemia)   ??? CVA (cerebral vascular accident) (CMS Dx)   ??? History of seizure   ??? Routine health maintenance   ??? Conductive hearing loss   ??? Excessive sleepiness   ??? Overweight (BMI 25.0-29.9)   ??? Abnormal liver diagnostic imaging   ??? Nausea and vomiting in adult   ??? Mixed hearing loss, bilateral       HM: Not sure if she wants to do mammogram. Last period was 6-8 months ago.   Alcohol misuse screening: performed, negative  Depression screening: performed, negative  Health Maintenance Due   Topic Date Due   ??? HIV Screening  09/26/1987    ??? Depression Screening  09/26/1987   ??? Immunization: DTaP/Tdap/Td (8 - Tdap) 07/14/2013   ??? Cervical Cancer Screening/Pap Smear (MyChart)  07/15/2014   ??? Annual Medicare Wellness Visit  02/24/2016     Annual Wellness Exam and Personalized Prevention Plan    Nicole Chen is a 46 y.o. woman here today for an initial Annual Wellness Exam.    Health Risk Assessment and Functional Status Review: Questionnaire completed by patient and I reviewed it and we discussed the following issues:  ?? Weight     Patient's Medical and Surgical History:      Past Medical History:   Diagnosis Date   ??? Abnormal finding     ent problems   ??? Cholesteatoma    ??? 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 Down's syndrome    ??? Dysfunction of eustachian tube 05/03/2005   ???  Hearing impairment    ??? Seizures (CMS Dx)    ??? Stroke (CMS Dx)    ??? Unspecified conductive hearing loss 05/03/2005   ??? Wears glasses        Past Surgical History:   Procedure Laterality Date   ??? CARDIAC SURGERY  1972   ??? INSERTION PE TUBES  1976       Patient Care Team:  Si Gaul, MD as PCP - General (Internal Medicine)  Mardene Speak Learn, MD as Consulting Physician (Cardiovascular Disease)  Chauncey Cruel, MD as Consulting Physician (Otolaryngology)    Focused Physical Assessment:   Vitals:    09/27/16 1156   BP: 108/72   BP Location: Right arm   Patient Position: Sitting   BP Cuff Size: Small   Pulse: 61   SpO2: 99%   Weight: 129 lb 3.2 oz (58.6 kg)   Height: 4' 8 (1.422 m)     Estimated body mass index is 28.97 kg/m?? as calculated from the following:    Height as of this encounter: 4' 8 (1.422 m).    Weight as of this encounter: 129 lb 3.2 oz (58.6 kg).  The patient does not have any cognitive impairment apparent during this visit.     Patient Counseling and Personalized Prevention Plan:  See after visit summary given to patient.      Review of systems:  Review of Systems   Constitutional: Negative for chills and fever.   HENT: Negative for congestion and  rhinorrhea.    Eyes: Negative for redness and itching.   Respiratory: Negative for cough and shortness of breath.    Cardiovascular: Negative for chest pain and leg swelling.   Gastrointestinal: Negative for abdominal pain, constipation and nausea.   Genitourinary: Negative for difficulty urinating, dysuria and menstrual problem.   Musculoskeletal: Negative for arthralgias and myalgias.   Skin: Negative for rash and wound.   Neurological: Negative for dizziness and light-headedness.   Psychiatric/Behavioral: Negative for depression and dysphoric mood.       Physical Exam:  Physical Exam   Constitutional: She is oriented to person, place, and time. She appears well-developed and well-nourished. No distress.   HENT:   Head: Normocephalic and atraumatic.   Eyes: Conjunctivae are normal. No scleral icterus.   Neck: Normal range of motion. Neck supple.   Cardiovascular: Normal rate, regular rhythm and normal heart sounds.    Pulmonary/Chest: Effort normal and breath sounds normal.   Abdominal: Soft. Bowel sounds are normal. She exhibits no distension.   Genitourinary:   Genitourinary Comments: Breast exam normal   Musculoskeletal: Normal range of motion. She exhibits no edema or tenderness.   Neurological: She is alert and oriented to person, place, and time.   Skin: Skin is warm and dry. She is not diaphoretic.   Psychiatric: She has a normal mood and affect. Her behavior is normal.       Vitals:    09/27/16 1156   BP: 108/72   BP Location: Right arm   Patient Position: Sitting   BP Cuff Size: Small   Pulse: 61   SpO2: 99%   Weight: 129 lb 3.2 oz (58.6 kg)   Height: 4' 8 (1.422 m)       Assessment and Plan:    HLD (hyperlipidemia)  Due for lipids. Check today.     Down's syndrome  Due for screening tests. Check labs today.     Overweight (BMI 25.0-29.9)  Weight improving - working on staying out of  the vending machines.     Routine health maintenance  PHQ-2 neg - spent 1 min screening. EtOH screen negative. Discussed  mammogram - would like to wait per mom.     Nausea and vomiting in adult  Doing well. No further w/u.     Abnormal liver diagnostic imaging  LFTs today.       Prior to the scheduled appointment, I reviewed the patient's recent laboratory values, active medical problems, and recent office visits in preparation for the visit today.     The patient understands his/her medication(s).  Side effects/barriers of these medications were addressed.  Over-the-counter medications were also addressed during this encounter.

## 2016-09-27 NOTE — Unmapped (Signed)
LFTs today.

## 2016-09-27 NOTE — Unmapped (Addendum)
Annual Wellness Visit Health Risk Assessment and Personalized Prevention Plan    Identified Risk Factors for Future Illness:  ?? Consider doing cancer screening.    Current Status of Health and Risk Factor Screening:  Health Maintenance   Topic Date Due   ??? HIV Screening  09/26/1987   ??? Depression Screening  09/26/1987   ??? Immunization: DTaP/Tdap/Td (8 - Tdap) 07/14/2013   ??? Cervical Cancer Screening/Pap Smear (MyChart)  07/15/2014   ??? Annual Medicare Wellness Visit  02/24/2016   ??? Immunization: Influenza (Season Ended) 11/24/2016   ??? Diabetes Screening  01/12/2019   ??? Lipid Panel  02/24/2020     Lab Results   Component Value Date    LDL 73 02/24/2015    LDL 58 12/03/2013     BP Readings from Last 3 Encounters:   09/27/16 108/72   01/12/16 120/52   09/18/15 122/78       Recommended Screening Schedule for Next 3 Years:    Screening  Test When to Do It   Screening Mammography Annually at 49.    Pelvic Exam and Pap Smear Not needed unless symptoms.   Colonoscopy At 46 years old   Bone Density Scan At 46 years old         Additional Preventive Advice:  ?? None - you are doing great!

## 2016-09-27 NOTE — Unmapped (Signed)
Doing well. No further w/u.

## 2016-09-27 NOTE — Assessment & Plan Note (Signed)
Due for screening tests. Check labs today.

## 2016-09-27 NOTE — Assessment & Plan Note (Signed)
Weight improving - working on staying out of [employer].

## 2016-09-27 NOTE — Unmapped (Signed)
Due for lipids. Check today.

## 2016-09-27 NOTE — Unmapped (Signed)
Chief Complaint   Patient presents with   ??? New Patient Visit/ Consultation     last seen 2015, ETD        Cancer Staging:  Cancer Staging  No matching staging information was found for the patient.      History of Present Illness:  Patient ID: Nicole Chen is a 46 y.o. female presents to the clinic for ear cleaning.  Hearing aids are not working. Hear for ear cleaning. She does not tolerate HP and oil well.    Has a history of cholesteatoma.     Pt denies otorrhea, otalgia    Patient present with mother today.    Audiogram today largely unchanged.  Has hearing loss.  NO dizziness    Past Medical History:  She has a past medical history of Abnormal finding; Cholesteatoma; Cholesteatoma (1976); Down's syndrome; Dysfunction of eustachian tube (05/03/2005); Hearing impairment; Seizures (CMS Dx); Stroke (CMS Dx); Unspecified conductive hearing loss (05/03/2005); and Wears glasses.  Past Surgical History:  She has a past surgical history that includes Cardiac surgery (1972) and insertion pe tubes (1976).  Family History:  Her family history includes Cancer in her mother and other; Diabetes in her maternal grandmother; Hearing loss in her other; Hypertension in her mother; Other in her cousin.  Social History:  She reports that she has never smoked. She has never used smokeless tobacco. She reports that she does not drink alcohol or use drugs.    Medications:  Current Outpatient Prescriptions   Medication Sig Dispense Refill   ??? aspirin 81 MG chewable tablet Chew 81 mg by mouth daily.     ??? multivitamin (MULTIPLE VITAMINS) tablet      ??? pravastatin (PRAVACHOL) 10 MG tablet TAKE 1 TABLET BY MOUTH DAILY 90 tablet 0     No current facility-administered medications for this visit.        Allergies:  Patient has no known allergies.    Physical Exam:  General:  Well-developed, well-nourished female in no acute distress.  Patient cognitively delayed.  The patient is not able to answer questions adequately. Mother is main  historian.  Psychiatric:  NL mood and affect.  A+O x 4.  Head and Face: AT-NC.   Eye:  Ocular motility NL.  No erythema or infection.  Skin:  Visual examination and palpation of the scalp, neck, and head reveals no significant rashes, ulcerations, nodules, or lesions.  Ears:  External ears have a normal appearance with no masses, lesions, or scars.   Weber right, rinne positive bilaterally  Cardiovascular:  Warm and well perfused.  Neurologic:  Facial nerve function grade I.  Other cranial nerves grossly WNL.     Procedure Note:  Otomicroscopic exam was done of both EAC's:  Right: EAC normal, mild cerumen removed with loops; TM with post-surgical graft changes, hypomobile; middle ear clear  Left: EAC normal, mild cerumen removed with loops; TM with mild retraction , myringosclerosis, mobilet; middle ear with probable fluid  NO SIGN OF CHOLESTEATOMA IN EITHER EAR.    Assessment & Plan:  Dear Dr. Dahlia Bailiff,  46 y.o. white female with mixed hearing loss AU, cerumen impaction AU  PMHx: Trisomy 21  tympanoplasty with mastoidectomy CWR AD for cholesteatoma  Pt here with mom, who is legal guardian (but sister is co-guardian)  Bilateral mixed hearing loss  - Audiogram reviewed with pt: mixed hearing loss AU  - Continue to follow with audiology for hearing aids  Use weekly baby oil drops in  each year.  - RTC in 3-5 years with audiogram  Recommend diet and exercise to reduce BMI (29); this will also help her hyperlipidemia  RECOMMEND PATIENT SEE MY PA, ADELE RAUEN, FOR ANY URGENT ISSUES (CARD GIVEN)  Note: If you have not done so already, please sign up for My Chart Health Portal at [email or web address] using the number provided on your After Visit Summary. It is an excellent way to communicate with the office as well as review your personal health records and testing results.  I have seen and examined the patient. I have discussed with the resident/fellow and agree with the findings and plan as documented in  the note.       Medical history questionnaire was reviewed by me with the patient. Review of systems is negative except as noted in the HPI and the scanned document dated from today.   Medical Decision Making:   * review/order radiology tests   * review/order other diagnostic or tx interventions   * discussed case with another provider (letter sent)   * Hearing Test Reviewed: yes   Patient Education:   * verbal information and/or pamphlet/ literature given to patient.   Risks & Benefits:   * Risks, benefits and treatment options discussed with patient.

## 2016-09-27 NOTE — Assessment & Plan Note (Signed)
PHQ-2 neg - spent 1 min screening. EtOH screen negative. Discussed mammogram - would like to wait per mom.

## 2016-09-27 NOTE — Unmapped (Signed)
Dear Dr. Dahlia Bailiff,  46 y.o. white female with mixed hearing loss AU, cerumen impaction AU  PMHx: Trisomy 21  tympanoplasty with mastoidectomy CWR AD for cholesteatoma  Pt here with mom, who is legal guardian (but sister is co-guardian)  Bilateral mixed hearing loss  - Audiogram reviewed with pt: mixed hearing loss AU  - Continue to follow with audiology for hearing aids  Use weekly baby oil drops in each year.  - RTC in 3-5 years with audiogram  RECOMMEND PATIENT SEE MY PA, ADELE RAUEN, FOR ANY URGENT ISSUES (CARD GIVEN)  Note: If you have not done so already, please sign up for My Chart Health Portal at [email or web address] using the number provided on your After Visit Summary. It is an excellent way to communicate with the office as well as review your personal health records and testing results.

## 2016-09-27 NOTE — Unmapped (Signed)
See attached audiogram

## 2016-11-05 MED ORDER — pravastatin (PRAVACHOL) 10 MG tablet
10 | ORAL_TABLET | Freq: Every day | ORAL | 3 refills | Status: AC
Start: 2016-11-05 — End: 2017-10-20

## 2016-11-05 NOTE — Unmapped (Signed)
Last Follow up/Annual PE: 09-27-16  Labs: 01-12-16  Next OV: none

## 2016-11-05 NOTE — Unmapped (Signed)
Pt' Mom calling for a RF on Pravastatin 10 mg. ~ 90 day.      Pharm in chart is corr.    Pt is completely out of med, and would like phoned in today.   Mom states pharm was supposed to fax this rf rqst 4 days go.    Pt aware MD is out, and cov'ing phys will respond.     Last OV : 09/27/16.

## 2017-10-21 MED ORDER — pravastatin (PRAVACHOL) 10 MG tablet
10 | ORAL_TABLET | ORAL | 0 refills | Status: AC
Start: 2017-10-21 — End: 2018-01-18

## 2017-10-21 NOTE — Telephone Encounter (Signed)
Last OV:09-27-16  Last PE:  09-27-16  Next OV: none

## 2018-01-18 MED ORDER — pravastatin (PRAVACHOL) 10 MG tablet
10 | ORAL_TABLET | ORAL | 0 refills | Status: AC
Start: 2018-01-18 — End: 2018-04-17

## 2018-01-18 NOTE — Telephone Encounter (Signed)
Last Refilled: #90 6/28/19Last visit with Provider: 09/27/2016 Si Gaul, MD  Next appointment in this department: Visit date not found  Last Medicare Annual Wellness Visit: 09/27/2016     Called the pt, spoke to her mother to get the pt scheduled for physical OV with Dr Nelta Numbers

## 2018-01-18 NOTE — Telephone Encounter (Signed)
Last Refilled: 10-21-17  Last visit with Provider: 09/27/2016 Si Gaul, MD  Next appointment in this department: Visit date not found  Last Medicare Annual Wellness Visit: 09/27/2016   No appt sched

## 2018-01-26 ENCOUNTER — Ambulatory Visit: Admit: 2018-01-26 | Payer: MEDICARE | Attending: Critical Care Medicine

## 2018-01-26 DIAGNOSIS — I6389 Other cerebral infarction: Secondary | ICD-10-CM

## 2018-01-26 NOTE — Patient Instructions (Signed)
Return if needed in 1 to 2 years.

## 2018-01-26 NOTE — Unmapped (Signed)
This 47 year old woman with a history of Down syndrome, ventricular septal defect repair, hearing loss, left MCA stroke was last seen in May of 2017.  Nicole Chen has been neurologically stable since the last visit.  She has had no new stroke-like events.  Her mother reports that she seems somewhat more forgetful however.  Her original stroke occurred when they were camping in 2013.  As part of her evaluation, I recommended that she see cardiology at [facility].  They performed some diagnostic tests and had no further recommendations.  She has been on aspirin and a statin agent since her stroke.    Her mother reports that the patient complains of headaches periodically as well as some neck discomfort.  She also complains of sensitivity to light.    The patient remains active.  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    On exam today, the patient followed simple instructions fairly well.  Her verbal output was somewhat limited.  She saw waving fingers in both visual fields.  She had no obvious facial weakness.  She had no pronator drift.  Her gait was rapid and stable.    IMPRESSION:  Nicole Chen has had no further stroke-like events since 2013 as far as I can tell.  She remains on 81 mg a day of aspirin as well as a low-dose statin agent.  The specific cause of her stroke was never determined but a cardiac source embolus would be more likely given her prior cardiac surgery.    Her mother is concerned about the cognitive changes she has noticed.  She may develop progressive cognitive changes associated with Alzheimer's disease.  Unfortunately there is no intervention that will substantially ameliorate this problem.  If her neck complaints become more persistent, imaging of the bones and ligaments of her neck with a CT scan and an MRI study would be of interest.  If there are structural issues there, however, the main intervention available would be a major neck operation.    PLAN:     1.  Continue aspirin and statin for now.    2.  Return in one to two years.        DK/jq  [id number]          Time Spent With Patient:   Time spent with patient was 25 minutes  Time spent discussing diagnosis, management and treatment plan was >15 Minutes.    Dictation number: [id number]

## 2018-04-17 MED ORDER — pravastatin (PRAVACHOL) 10 MG tablet
10 | ORAL_TABLET | ORAL | 0 refills | Status: AC
Start: 2018-04-17 — End: 2018-07-18

## 2018-04-17 NOTE — Unmapped (Signed)
Last Refilled: Pravastatin 10 mg # 90 no refill 01/18/2018  Last visit with Provider: 09/27/2016 Si Gaul, MD  Next appointment in this department: 06/07/2018 Si Gaul, MD  Last Medicare Annual Wellness Visit: 09/27/2016     Last Office Visit: Visit date not found

## 2018-06-05 NOTE — Unmapped (Signed)
Pt is asking if you can ord BW prior to phys ~ 2/12.    Will use UC lab.

## 2018-06-06 NOTE — Unmapped (Signed)
Orders placed

## 2018-06-06 NOTE — Unmapped (Signed)
Tried to reach pt NA LM to please call me back

## 2018-06-07 NOTE — Unmapped (Signed)
Spoke to [place], pt's mother. Had to change appt due to how late this appt was and having labs that are fasting. Pt could not go that long without eating.   Rescheduled pt for 2/25.

## 2018-06-20 ENCOUNTER — Ambulatory Visit: Admit: 2018-06-20 | Discharge: 2018-06-20 | Payer: MEDICARE

## 2018-06-20 ENCOUNTER — Other Ambulatory Visit: Admit: 2018-06-20 | Payer: MEDICARE

## 2018-06-20 DIAGNOSIS — Z1239 Encounter for other screening for malignant neoplasm of breast: Secondary | ICD-10-CM

## 2018-06-20 DIAGNOSIS — E663 Overweight: Secondary | ICD-10-CM

## 2018-06-20 LAB — RENAL FUNCTION PANEL W/EGFR
Albumin: 4.1 g/dL (ref 3.5–5.7)
Anion Gap: 7 mmol/L (ref 3–16)
BUN: 15 mg/dL (ref 7–25)
CO2: 31 mmol/L (ref 21–33)
Calcium: 8.9 mg/dL (ref 8.6–10.3)
Chloride: 103 mmol/L (ref 98–110)
Creatinine: 0.71 mg/dL (ref 0.60–1.30)
Glucose: 95 mg/dL (ref 70–100)
Osmolality, Calculated: 293 mOsm/kg (ref 278–305)
Phosphorus: 3.8 mg/dL (ref 2.1–4.7)
Potassium: 4.2 mmol/L (ref 3.5–5.3)
Sodium: 141 mmol/L (ref 133–146)
eGFR AA CKD-EPI: >90 See note.
eGFR NONAA CKD-EPI: >90 See note.

## 2018-06-20 LAB — CBC
Hematocrit: 44.3 % (ref 35.0–45.0)
Hemoglobin: 14.3 g/dL (ref 11.7–15.5)
MCH: 32 pg (ref 27.0–33.0)
MCHC: 32.3 g/dL (ref 32.0–36.0)
MCV: 99.1 fL (ref 80.0–100.0)
MPV: 6.7 fL (ref 7.5–11.5)
Platelets: 345 10*3/uL (ref 140–400)
RBC: 4.47 10*6/uL (ref 3.80–5.10)
RDW: 13.6 % (ref 11.0–15.0)
WBC: 4.8 10*3/uL (ref 3.8–10.8)

## 2018-06-20 LAB — THYROID FUNCTION CASCADE: TSH: 2.03 u[IU]/mL (ref 0.45–4.12)

## 2018-06-20 LAB — HEMOGLOBIN A1C: Hemoglobin A1C: 5.2 % (ref 4.0–5.6)

## 2018-06-20 NOTE — Unmapped (Signed)
Let me know if having more difficulty with memory or language.     Consider doing a mammogram.     Annual Wellness Visit Health Risk Assessment and Personalized Prevention Plan    Identified Risk Factors for Future Illness:  ?? None identified.     Current Status of Health and Risk Factor Screening:  Health Maintenance   Topic Date Due   ??? HIV Screening  09/26/1987   ??? Immunization: DTaP/Tdap/Td (8 - Tdap) 07/14/2013   ??? Cervical Cancer Screening/Pap Smear (MyChart)  07/15/2014   ??? Annual Medicare Wellness Visit  09/27/2017   ??? Immunization: Influenza (1) 01/24/2018   ??? Diabetes Screening  01/12/2019   ??? Depression Screening  01/27/2019   ??? Lipid Panel  02/24/2020   ??? Immunization: Pneumococcal  Aged Out     Lab Results   Component Value Date    LDL 73 02/24/2015    LDL 58 12/03/2013     BP Readings from Last 3 Encounters:   06/20/18 100/68   01/26/18 106/60   09/27/16 135/67       Recommended Screening Schedule for Next 3 Years:    Screening  Test When to Do It   Screening Mammography Annually - now or at 48 years old   Pelvic Exam and Pap Smear Not needed unless symptoms.   Colonoscopy Can discuss at age 83   Bone Density Scan At 48 years old         Additional Preventive Advice:  ?? None - you are doing great!  ??

## 2018-06-20 NOTE — Unmapped (Signed)
Progress Note    Chief Complaint:  Chief Complaint   Patient presents with   ??? Annual Exam       History of Present Illness:  48 y.o. female with history of down's syndrome, remote stroke, overweight here for physical.     Problem   Cva (Cerebral Vascular Accident) (Cms Dx)    On ASA, pravastatin 10  06/20/2018:   Lab Results   Component Value Date    CHOLTOT 152 02/24/2015    TRIG 86 02/24/2015    HDL 62 02/24/2015    LDL 73 02/24/2015    LDLCALC 76 02/02/2011          Down's Syndrome    Lives with mom who is guardian.  06/20/2018: doing well - very active with music class, [employer], bowling league, dancing. Mom with her all the time when not at these activities. Does well with ADLs, but mom worried about her judgement with making decisions on her own. 1 time, worried about her memory and speech. No trouble with doing tasks, just sometimes doesn't answer questions the same way. Reading a book about alzheimers in down syndrome.   Lab Results   Component Value Date    WBC 5.8 01/12/2016    HGB 15.7 (H) 01/12/2016    HCT 44.9 01/12/2016    MCV 99.3 01/12/2016    PLT 316 01/12/2016     Lab Results   Component Value Date    TSH 2.20 01/12/2016          Overweight (Bmi 25.0-29.9)    06/21/2018: Weight stable from last year. Discussed healthy eating.   Wt Readings from Last 3 Encounters:   06/20/18 118 lb 4.8 oz (53.7 kg)   01/26/18 117 lb (53.1 kg)   09/27/16 129 lb 9.6 oz (58.8 kg)          Routine Health Maintenance     HM: Due for pap - declines, fluvax - declines.   Alcohol misuse screening: performed, negative  Depression screening: performed, negative  Health Maintenance Due   Topic Date Due   ??? HIV Screening  09/26/1987   ??? Immunization: DTaP/Tdap/Td (8 - Tdap) 07/14/2013   ??? Cervical Cancer Screening/Pap Smear (MyChart)  07/15/2014   ??? Annual Medicare Wellness Visit  09/27/2017   ??? Immunization: Influenza (1) 01/24/2018     Annual Wellness Exam and Personalized Prevention Plan    Nicole Chen is a 48 y.o.  woman here today for an initial Annual Wellness Exam.    Health Risk Assessment and Functional Status Review: Questionnaire completed by patient and I reviewed it and we discussed the following issues:  ?? ? Memory difficulty?      Patient's Medical and Surgical History:      Past Medical History:   Diagnosis Date   ??? Abnormal finding     ent problems   ??? Cholesteatoma    ??? 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 Down's syndrome    ??? Dysfunction of eustachian tube 05/03/2005   ??? Hearing impairment    ??? Seizures (CMS Dx)    ??? Stroke (CMS Dx)    ??? Unspecified conductive hearing loss 05/03/2005   ??? Wears glasses        Past Surgical History:   Procedure Laterality Date   ??? CARDIAC SURGERY  1972   ??? INSERTION PE TUBES  1976       Patient Care Team:  Si Gaul, MD as  PCP - General (Internal Medicine)  Mardene Speak Learn, MD as Consulting Physician (Cardiovascular Disease)  Chauncey Cruel, MD as Consulting Physician (Otolaryngology)    Focused Physical Assessment:   Vitals:    06/20/18 1013   BP: 100/68   BP Location: Right arm   Patient Position: Sitting   BP Cuff Size: Small   Pulse: 64   SpO2: 98%   Weight: 118 lb 4.8 oz (53.7 kg)   Height: 4' 8 (1.422 m)     Estimated body mass index is 26.52 kg/m?? as calculated from the following:    Height as of this encounter: 4' 8 (1.422 m).    Weight as of this encounter: 118 lb 4.8 oz (53.7 kg).  The patient's cognitive impairment appears to be at baseline.     Patient Counseling and Personalized Prevention Plan:  See after visit summary given to patient.      Review of systems:  Review of Systems   Constitutional: Negative for chills and fever.   HENT: Negative for congestion and rhinorrhea.    Eyes: Negative for redness and itching.   Respiratory: Negative for cough and shortness of breath.    Cardiovascular: Negative for chest pain and leg swelling.   Gastrointestinal: Negative for abdominal pain, constipation and nausea.   Genitourinary: Negative for difficulty  urinating and dysuria.   Musculoskeletal: Positive for arthralgias (tripped on the stairs and had a bruise on the leg - much improved). Negative for gait problem and myalgias.   Skin: Negative for rash and wound.   Neurological: Negative for dizziness and light-headedness.   Psychiatric/Behavioral: Negative for behavioral problems, confusion, depression and dysphoric mood.       Physical Exam:  Physical Exam  Constitutional:       General: She is not in acute distress.     Appearance: She is well-developed. She is not diaphoretic.   HENT:      Head: Normocephalic and atraumatic.   Eyes:      General: No scleral icterus.     Conjunctiva/sclera: Conjunctivae normal.   Neck:      Musculoskeletal: Normal range of motion and neck supple.   Cardiovascular:      Rate and Rhythm: Normal rate and regular rhythm.      Heart sounds: Normal heart sounds.   Pulmonary:      Effort: Pulmonary effort is normal.      Breath sounds: Normal breath sounds.   Abdominal:      General: Bowel sounds are normal. There is no distension.      Palpations: Abdomen is soft.   Musculoskeletal: Normal range of motion.         General: No tenderness.   Skin:     General: Skin is warm and dry.   Neurological:      Mental Status: She is alert and oriented to person, place, and time.   Psychiatric:         Mood and Affect: Mood normal.         Behavior: Behavior normal.         Vitals:    06/20/18 1013   BP: 100/68   BP Location: Right arm   Patient Position: Sitting   BP Cuff Size: Small   Pulse: 64   SpO2: 98%   Weight: 118 lb 4.8 oz (53.7 kg)   Height: 4' 8 (1.422 m)       Assessment and Plan:    Down's syndrome  Not clear if developing  memory issues- was able to recite names of people on bowling team, sisters names. Answers questions seemingly appropriately to me. Advised mom to monitor and let me know if worsening. Labs today for screening. No excessive sleepiness so no concern for OSA.     CVA (cerebral vascular accident) (CMS Dx)  Lipids  today.     Overweight (BMI 25.0-29.9)  DIscussed healthy eating.     Routine health maintenance  PHQ-2 negative -spent 1 min screening. EtOH screen negative. Labs today. Discussed mammogram screening - mom will consider if going to proceed.       Prior to the scheduled appointment, I reviewed the patient's recent laboratory values, active medical problems, and recent office visits in preparation for the visit today.     The patient understands his/her medication(s).  Side effects/barriers of these medications were addressed.  Over-the-counter medications were also addressed during this encounter.

## 2018-06-21 NOTE — Unmapped (Signed)
PHQ-2 negative -spent 1 min screening. EtOH screen negative. Labs today. Discussed mammogram screening - mom will consider if going to proceed.

## 2018-06-21 NOTE — Unmapped (Signed)
Not clear if developing memory issues- was able to recite names of people on bowling team, sisters names. Answers questions seemingly appropriately to me. Advised mom to monitor and let me know if worsening. Labs today for screening. No excessive sleepiness so no concern for OSA.

## 2018-06-21 NOTE — Unmapped (Signed)
DIscussed healthy eating.

## 2018-06-21 NOTE — Unmapped (Signed)
Lipids today.

## 2018-07-18 NOTE — Unmapped (Signed)
Received faxed refill request for pravastatin 10mg  tablets    Last Refilled: 04/17/2018 #90 rf0  Last visit with Provider: 06/20/2018 Si Gaul, MD  Next appointment in this department: Visit date not found  Last Medicare Annual Wellness Visit: 06/20/2018     Last Office Visit: 06/20/2018

## 2018-07-19 MED ORDER — pravastatin (PRAVACHOL) 10 MG tablet
10 | ORAL_TABLET | Freq: Every day | ORAL | 3 refills | Status: AC
Start: 2018-07-19 — End: 2019-07-09

## 2019-07-09 MED ORDER — pravastatin (PRAVACHOL) 10 MG tablet
10 | ORAL_TABLET | ORAL | 0 refills | Status: AC
Start: 2019-07-09 — End: 2019-10-08

## 2019-07-09 NOTE — Unmapped (Signed)
Last Refilled: 07/19/2018  Last visit with Provider: 06/20/2018 Si Gaul, MD  Next appointment in this department: Visit date not found  Last Medicare Annual Wellness Visit: 06/20/2018     Last Office Visit: Visit date not found

## 2019-10-08 MED ORDER — pravastatin (PRAVACHOL) 10 MG tablet
10 | ORAL_TABLET | Freq: Every day | ORAL | 0 refills | Status: AC
Start: 2019-10-08 — End: 2020-09-29

## 2019-10-08 NOTE — Unmapped (Signed)
Last Refilled: 07-09-19  Last visit with Provider: 06/20/2018 Si Gaul, MD  Next appointment in this department: 10/09/2019 Si Gaul, MD  Last Medicare Annual Wellness Visit: 06/20/2018     NEEDS PHYSICAL PRIOR TO NEXT REFILL

## 2019-10-09 ENCOUNTER — Ambulatory Visit: Admit: 2019-10-09 | Discharge: 2019-10-09 | Payer: MEDICARE

## 2019-10-09 ENCOUNTER — Other Ambulatory Visit: Admit: 2019-10-09 | Payer: MEDICARE

## 2019-10-09 DIAGNOSIS — Z114 Encounter for screening for human immunodeficiency virus [HIV]: Secondary | ICD-10-CM

## 2019-10-09 DIAGNOSIS — Q909 Down syndrome, unspecified: Secondary | ICD-10-CM

## 2019-10-09 DIAGNOSIS — I6389 Other cerebral infarction: Secondary | ICD-10-CM

## 2019-10-09 LAB — COMPREHENSIVE METABOLIC PANEL
ALT: 23 U/L (ref 7–52)
AST: 22 U/L (ref 13–39)
Albumin: 4.3 g/dL (ref 3.5–5.7)
Alkaline Phosphatase: 62 U/L (ref 36–125)
Anion Gap: 10 mmol/L (ref 3–16)
BUN: 13 mg/dL (ref 7–25)
CO2: 30 mmol/L (ref 21–33)
Calcium: 9.3 mg/dL (ref 8.6–10.3)
Chloride: 104 mmol/L (ref 98–110)
Creatinine: 0.79 mg/dL (ref 0.60–1.30)
Glucose: 100 mg/dL (ref 70–100)
Osmolality, Calculated: 298 mOsm/kg (ref 278–305)
Potassium: 4.1 mmol/L (ref 3.5–5.3)
Sodium: 144 mmol/L (ref 133–146)
Total Bilirubin: 1 mg/dL (ref 0.0–1.5)
Total Protein: 7.1 g/dL (ref 6.4–8.9)
eGFR AA CKD-EPI: >90 See note.
eGFR NONAA CKD-EPI: 87 See note.

## 2019-10-09 LAB — CBC
Hematocrit: 43.6 % (ref 35.0–45.0)
Hemoglobin: 14.9 g/dL (ref 11.7–15.5)
MCH: 34.4 pg (ref 27.0–33.0)
MCHC: 34.1 g/dL (ref 32.0–36.0)
MCV: 100.8 fL (ref 80.0–100.0)
MPV: 7 fL (ref 7.5–11.5)
Platelets: 284 10*3/uL (ref 140–400)
RBC: 4.32 10*6/uL (ref 3.80–5.10)
RDW: 13.3 % (ref 11.0–15.0)
WBC: 4 10*3/uL (ref 3.8–10.8)

## 2019-10-09 LAB — DIFFERENTIAL
Basophils Absolute: 49 /uL (ref 0–200)
Basophils Relative: 1.2 % (ref 0.0–1.0)
Eosinophils Absolute: 49 /uL (ref 15–500)
Eosinophils Relative: 1.2 % (ref 0.0–8.0)
Lymphocytes Absolute: 910 /uL (ref 850–3900)
Lymphocytes Relative: 22.2 % (ref 15.0–45.0)
Monocytes Absolute: 271 /uL (ref 200–950)
Monocytes Relative: 6.6 % (ref 0.0–12.0)
Neutrophils Absolute: 2821 /uL (ref 1500–7800)
Neutrophils Relative: 68.8 % (ref 40.0–80.0)
nRBC: 0 /100 WBC (ref 0–0)

## 2019-10-09 LAB — LIPID PANEL
Cholesterol, Total: 171 mg/dL (ref 0–200)
HDL: 70 mg/dL (ref 60–92)
LDL Cholesterol: 81 mg/dL
Triglycerides: 101 mg/dL (ref 10–149)

## 2019-10-09 LAB — HEMOGLOBIN A1C: Hemoglobin A1C: 5 % (ref 4.0–5.6)

## 2019-10-09 LAB — HIV 1+2 ANTIBODY/ANTIGEN WITH REFLEX: HIV 1+2 AB/AGN: NONREACTIVE

## 2019-10-09 LAB — THYROID FUNCTION CASCADE: TSH: 1.01 u[IU]/mL (ref 0.45–4.12)

## 2019-10-09 LAB — VITAMIN B12: Vitamin B-12: 548 pg/mL (ref 180–914)

## 2019-10-09 NOTE — Unmapped (Signed)
Vent rate: 59 bpm  PR Interval: 126 ms  QRS duration: 132 ms   QT/QTc: 448/443 ms  P-R-T axes: 75 -63 -25

## 2019-10-09 NOTE — Assessment & Plan Note (Signed)
Memory sx described c/w normal aging. Repeat head CT given spell of altered cognition to make sure no new strokes.

## 2019-10-09 NOTE — Unmapped (Signed)
Annual Physical Progress Note    Chief Complaint:  Chief Complaint   Patient presents with   ??? Annual Exam     did not get covid or shingrix. leery to injections.        History of Present Illness:  49 y.o. female with history of CVA, HLD, down syndrome,   here for physical.     Problem   Hld (Hyperlipidemia)    On pravastatin 10  09/26/2016:   Lab Results   Component Value Date    CHOLTOT 152 02/24/2015    TRIG 86 02/24/2015    HDL 62 02/24/2015    LDL 73 02/24/2015    LDLCALC 76 02/02/2011          Cva (Cerebral Vascular Accident) (Cms Dx)    On ASA, pravastatin 10  2/2 h/o repaired VSD  10/09/2019: had spell of 30 seconds of altered speech a few months ago. Resolved quickly. Mom wondering if having TIAs.   Lab Results   Component Value Date    CHOLTOT 152 02/24/2015    TRIG 86 02/24/2015    HDL 62 02/24/2015    LDL 73 02/24/2015          Down's Syndrome    Lives with mom who is guardian.  10/09/2019: memory is slightly worsening - forgetting what she goes to get, but quickly course corrects. Still taking care of herself as she has in the past.   Lab Results   Component Value Date    WBC 4.8 06/20/2018    HGB 14.3 06/20/2018    HCT 44.3 06/20/2018    MCV 99.1 06/20/2018    PLT 345 06/20/2018     Lab Results   Component Value Date    TSH 2.03 06/20/2018          Abnormal Liver Diagnostic Imaging    Seen on imaging. Labs all normal previously  10/09/2019:   Lab Results   Component Value Date    ALT 30 01/12/2016    AST 23 01/12/2016    ALKPHOS 66 01/12/2016    BILITOT 0.6 01/12/2016        Overweight (Bmi 25.0-29.9)    10/09/2019: Weight stable from last year. Discussed healthy eating.   Wt Readings from Last 3 Encounters:   06/20/18 118 lb 4.8 oz (53.7 kg)   01/26/18 117 lb (53.1 kg)   09/27/16 129 lb 9.6 oz (58.8 kg)          Routine Health Maintenance     HM: Due for covid vaccine, shingrix.   Alcohol misuse screening: performed, negative  Depression screening: performed, negative  Health Maintenance Due   Topic Date  Due   ??? Immunization: DTaP/Tdap/Td (6 - Tdap) 09/25/1980   ??? Immunization: COVID-19 (1) Never done   ??? Annual Medicare Wellness Visit  06/21/2019   ??? Depression Screening  06/22/2019   ??? Immunization: Zoster (1 of 2) Never done     Annual Wellness Exam and Personalized Prevention Plan    Jeraldean J Milam is a 49 y.o. woman here today for an initial Annual Wellness Exam.    Health Risk Assessment and Functional Status Review: Questionnaire completed by patient and I reviewed it and we discussed the following issues:  ?? Spell of altered speech.  ?? Has a dog and would like it to be an emotional support animal.     Patient's Medical and Surgical History:      Past Medical History:   Diagnosis Date   ???  Abnormal finding     ent problems   ??? Cholesteatoma    ??? 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 Down's syndrome    ??? Dysfunction of eustachian tube 05/03/2005   ??? Hearing impairment    ??? Seizures (CMS Dx)    ??? Stroke (CMS Dx)    ??? Unspecified conductive hearing loss 05/03/2005   ??? Wears glasses        Past Surgical History:   Procedure Laterality Date   ??? CARDIAC SURGERY  1972   ??? INSERTION PE TUBES  1976       Patient Care Team:  Si Gaul, MD as PCP - General (Internal Medicine)  Mardene Speak Learn, MD as Consulting Physician (Cardiovascular Disease)  Chauncey Cruel, MD as Consulting Physician (Otolaryngology)    Focused Physical Assessment:   Vitals:    10/09/19 0949   BP: 130/65   BP Location: Right arm   Patient Position: Sitting   BP Cuff Size: Small   Pulse: 59   SpO2: 99%   Weight: 122 lb 8 oz (55.6 kg)   Height: 4' 8.5 (1.435 m)     Estimated body mass index is 26.98 kg/m?? as calculated from the following:    Height as of this encounter: 4' 8.5 (1.435 m).    Weight as of this encounter: 122 lb 8 oz (55.6 kg).  The patient does not have any cognitive impairment apparent during this visit.     Patient Counseling and Personalized Prevention Plan:  See after visit summary given to patient.      Review of  systems:  Review of Systems   Constitutional: Negative for chills and fever.   HENT: Negative for congestion and rhinorrhea.    Eyes: Negative for redness and itching.   Respiratory: Negative for cough and shortness of breath.    Cardiovascular: Negative for chest pain and leg swelling.   Gastrointestinal: Negative for abdominal pain, constipation and nausea.   Genitourinary: Negative for difficulty urinating and dysuria.   Musculoskeletal: Negative for arthralgias and myalgias.   Skin: Negative for rash and wound.   Neurological: Positive for speech difficulty. Negative for dizziness and light-headedness.   Psychiatric/Behavioral:        Occasional memory lapses       Physical Exam:  Physical Exam  Constitutional:       General: She is not in acute distress.     Appearance: She is well-developed. She is not diaphoretic.   HENT:      Head: Normocephalic and atraumatic.   Eyes:      General: No scleral icterus.     Conjunctiva/sclera: Conjunctivae normal.   Cardiovascular:      Rate and Rhythm: Normal rate and regular rhythm.      Heart sounds: Normal heart sounds.   Pulmonary:      Effort: Pulmonary effort is normal.      Breath sounds: Normal breath sounds.   Abdominal:      General: Bowel sounds are normal. There is no distension.      Palpations: Abdomen is soft.   Genitourinary:     Comments: Breast exam normal  Musculoskeletal:         General: No tenderness. Normal range of motion.      Cervical back: Normal range of motion and neck supple.   Skin:     General: Skin is warm and dry.   Neurological:      Mental Status: She  is alert. Mental status is at baseline.   Psychiatric:         Behavior: Behavior normal.         Vitals:    10/09/19 0949   BP: 130/65   Pulse: 59   SpO2: 99%       Assessment and Plan:    Down's syndrome  Memory sx described c/w normal aging. Repeat head CT given spell of altered cognition to make sure no new strokes.     HLD (hyperlipidemia)  Lipids today.     CVA (cerebral vascular  accident) (CMS Dx)  Spell could have been a TIA. Repeat head CT to r/o new infarct. EKG with normal rhythm. Could consider event monitor to r/o a. Fib if new infarct.     Abnormal liver diagnostic imaging  LFTs today.     Routine health maintenance  Long discussion about covid vaccine - they would like to wait on it. Labs today. PHQ-2 neg, EtOH screen negative. Pt due for mammogram, cscope. Mom would like to defer for now given difficulty of screenings.       Prior to the scheduled appointment, I reviewed the patient's recent laboratory values, active medical problems, and recent office visits in preparation for the visit today.       Number and Complexity of Problems (Level 4)  During this encounter, I addressed 2 or more stable chronic illnesses.    Amount and/or Complexity of Data Ordered, Reviewed, or Analyzed (Level 4)  I reviewed 1 external note(s) from: neurology.  I reviewed 3+ test(s) including:.  I ordered 3+ test(s) including:.    Risk of Complication and/or Morbidity or Mortality of Patient Management (Level  3)  The patient's management entails Low risk of complications and/or morbidity or mortality.    Overall LOS:  4

## 2019-10-09 NOTE — Unmapped (Signed)
Long discussion about covid vaccine - they would like to wait on it. Labs today. PHQ-2 neg, EtOH screen negative. Pt due for mammogram, cscope. Mom would like to defer for now given difficulty of screenings.

## 2019-10-09 NOTE — Assessment & Plan Note (Signed)
Spell could have been a TIA. Repeat head CT to r/o new infarct. EKG with normal rhythm. Could consider event monitor to r/o a. Fib if new infarct.

## 2019-10-09 NOTE — Unmapped (Signed)
LFTs today.

## 2019-10-09 NOTE — Progress Notes (Signed)
Mailing to them

## 2019-10-09 NOTE — Unmapped (Signed)
Lipids today.

## 2019-10-09 NOTE — Patient Instructions (Addendum)
Annual Wellness Visit Health Risk Assessment and Personalized Prevention Plan    Identified Risk Factors for Future Illness:   Cancer screening: consider breast and colon cancer screening. Let me know if you decide to do this.    Schedule CT scan of the head to rule out new strokes. If she has another episode, let me know right away.      Current Status of Health and Risk Factor Screening:  Health Maintenance   Topic Date Due    Immunization: DTaP/Tdap/Td (6 - Tdap) 09/25/1980    Immunization: COVID-19 (1) Never done    HIV Screening  Never done    Cervical Cancer Screening/Pap Smear (MyChart)  07/15/2014    Annual Medicare Wellness Visit  06/21/2019    Alcohol Misuse Screening  06/22/2019    Depression Screening  06/22/2019    Immunization: Zoster (1 of 2) Never done    Mammogram (MyChart)  09/26/2019    Colon Cancer Screening / Stool Testing  09/26/2019    Colon Cancer Screening / Colonoscopy (MyChart)  09/26/2019    Immunization: Influenza (Season Ended) 12/26/2019    Lipid Panel  02/24/2020    Diabetes Screening  06/20/2021    Hepatitis C Screening (MyChart)  Completed    Immunization: Pneumococcal  Aged Out     Lab Results   Component Value Date    LDL 73 02/24/2015    LDL 58 12/03/2013    LDL 76 02/02/2011     BP Readings from Last 3 Encounters:   10/09/19 130/65   06/20/18 100/68   01/26/18 106/60       Recommended Screening Schedule for Next 3 Years:    Screening  Test When to Do It   Screening Mammography Annually - now   Pelvic Exam and Pap Smear Not needed unless symptoms.   Colonoscopy NOW   Bone Density Scan At 49 years old         Additional Preventive Advice:   None - you are doing great!

## 2019-10-26 ENCOUNTER — Inpatient Hospital Stay: Admit: 2019-10-26 | Payer: MEDICARE

## 2019-10-26 DIAGNOSIS — R4189 Other symptoms and signs involving cognitive functions and awareness: Secondary | ICD-10-CM

## 2019-10-30 NOTE — Telephone Encounter (Signed)
CT scan with 2 old strokes. Obtain ECHO, carotid dopplers. follow-up with Dr. Mary Sella.

## 2019-11-09 ENCOUNTER — Inpatient Hospital Stay: Admit: 2019-11-09 | Discharge: 2019-11-09 | Payer: MEDICARE

## 2019-11-09 ENCOUNTER — Ambulatory Visit: Admit: 2019-11-09 | Payer: MEDICARE

## 2019-11-09 DIAGNOSIS — I639 Cerebral infarction, unspecified: Secondary | ICD-10-CM

## 2019-11-13 NOTE — Unmapped (Signed)
Carotid dopplers negative for plaque. Pt has appt with neuro in November.     ECHO with no seen with no shunt, but with mild diffuse hypokinesis which is new from 2015 and 2013 ECHO. Hasn't seen cards since 2015 - refer back to [facility].

## 2019-11-19 NOTE — Unmapped (Signed)
Spoke to mother, she gave me a referral number.   I looked at referrals and it is from neurology.   I gave her the phone number for her to call.     Also requested results be sent to her.   Mailed.

## 2019-11-19 NOTE — Unmapped (Signed)
Not from me

## 2019-11-19 NOTE — Telephone Encounter (Signed)
Pt's mother Is calling to speak with MA or PCP...says she received a letter from [facility] dated  11/04/2019, to sched an appt with Pt's PCP(?)    Pt's mother is unsure of anything, said the letter gave her obscure details. Pt's chart also has no record of this letter.   Would [place] know anything(?).Marland KitchenMarland Kitchenplease call Pt's mother

## 2019-11-23 NOTE — Telephone Encounter (Signed)
Pt was told to sch appt w/ former cardiologist Dr Cristal Deer Learn-  He was practicing at [facility].      Pt has not seen him for 5 yrs and mother does not have way of reaching him.

## 2019-11-23 NOTE — Unmapped (Signed)
Tried to reach pt NA LM to please call me back

## 2019-11-23 NOTE — Unmapped (Signed)
Spoke with pt mother she is okay with est with another Adult Cardi at [facility] may need a referral but also wants help picking a phy. Unless you have another plan for her cardiac needs.   Please advise, okay to wait for [place] return

## 2019-11-27 NOTE — Unmapped (Signed)
Evelyn notified

## 2019-11-27 NOTE — Unmapped (Signed)
She can call the adult congenital heart disease clinic at [phone] to schedule an appointment. I don't have the name of a specific doc there.

## 2020-01-07 ENCOUNTER — Ambulatory Visit: Payer: MEDICARE | Attending: Cardiovascular Disease

## 2020-02-11 ENCOUNTER — Ambulatory Visit: Payer: MEDICARE | Attending: Cardiovascular Disease

## 2020-02-27 ENCOUNTER — Ambulatory Visit: Admit: 2020-02-27 | Payer: MEDICARE | Attending: Critical Care Medicine

## 2020-02-27 DIAGNOSIS — I639 Cerebral infarction, unspecified: Secondary | ICD-10-CM

## 2020-02-27 NOTE — Unmapped (Signed)
HISTORY OF PRESENT ILLNESS:  This 49 year old woman with a history of Down syndrome, ventricular septal defect repair, hearing loss, left MCA stroke, was last seen in October 2019.  There have been no major neurologic events since the last visit.  Her mother reports that the patient has been somewhat more forgetful.  She cannot follow a sequence of instructions as well as she used to.  She continues to bowl, and she works with papers and coloring.  She reports an occasional headache.  Upon further questioning, it sounds like her walking is somewhat more unstable, and it sounds like she has had some falls.    A head CT was done this summer at [facility].  This study showed basal ganglia calcifications and remote right frontal and left inferior frontal infarcts.  An echocardiogram was done in July.  This study describes a reduced ejection fraction of 45% to 50% with reduced systolic function in the right ventricle and a trivial pericardial effusion.  It does not look like a bubble study was done.  [name] was not sure today why those studies were requested.  Looking back in the notes in Epic, it appears that a 30 second spell of speech difficulty was described in the spring, and this apparently prompted the ordering of these tests.    The patient has a new cardiologist here at [facility] whom she will be seeing in the near future.    PHYSICAL EXAMINATION:  On exam today, the patient was calm and visually attentive.  She followed simple instructions fairly well.  She had a limited verbal output, but it was understandable.  She had no apparent visual field loss, and her extraocular movements were full range.  She had no pronator drift.  She moved her legs well in a seated position.  When she walked, however, she appeared nonspecifically unsteady.  She looked like she might tip over at one point.  She had a slightly reduced stride length and a somewhat wider base.    IMPRESSION:  The head CT  this summer showed prior left frontal and right frontal infarcts.  My notes from 2013 include description of an outside MRI from 2012, which showed an acute left frontal cortical infarct and a small old right frontal infarct.  I do not have her 2012 outside images at the moment, but I suspect the findings on the summer's head CT are similar to what was described back in 2012.  This would mean that she has not had any new visible infarcts of any size in the past 9 years on aspirin therapy.  However, I recommended that they continue to follow up with Cardiology here at [facility], as she may develop new or ongoing risk of embolic stroke that might require further interventions.    I briefly reviewed with the patient's mother the fact that patients with Down syndrome often develop Alzheimer disease as well as gait disorders as they age.  Unfortunately, we have no curative treatments for those conditions or interventions that are highly effective in affecting the symptom progression.    Physical Therapy evaluation for gait safety might be useful in the near future.  [name] will think it over.      PLAN:    1. Recommend continuing aspirin, statin, and followup with Cardiology as planned.  2. Return here as needed.  It sounds like Ms. Dorsch has been neurologically stable since the last visit.  Shellia Cleverly, MD      DK/AQ  DD:  02/27/2020 11:42:59  DT:  02/28/2020 04:58:02    JOB#:  [id number]          - Total time I spent for E/M services on the date of the encounter: 40 minutes   Dictation number: [id number]

## 2020-02-28 ENCOUNTER — Inpatient Hospital Stay: Admit: 2020-02-28

## 2020-03-27 ENCOUNTER — Ambulatory Visit: Admit: 2020-03-27 | Payer: MEDICARE | Attending: Cardiovascular Disease

## 2020-03-27 DIAGNOSIS — Z8774 Personal history of (corrected) congenital malformations of heart and circulatory system: Secondary | ICD-10-CM

## 2020-03-27 NOTE — Unmapped (Signed)
-   No medication changes today   - Follow up in clinic in 1 year

## 2020-03-27 NOTE — Unmapped (Signed)
CARDIOLOGY OUTPATIENT OFFICE VISIT    Subjective:       Patient ID: Nicole Chen is a 49 y.o. female.    Chief Complaint:  Establish care    HPI:  49 y.o. 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     Patient seen today with her mother and family friend. Patient is doing well overall and does not seem to have any cardiac complaints. She will occasionally say she has brief chest pains on rare occasions but nothing reproducible or concerning to her mother. No palpitations, syncope, edema, or dyspnea. She has been falling occasionally lately and patient's mother states her neurologist had concerns about gait stability.     Patient was born with a secundum ASD and a VSD. There was no endocardial cushion defect. She followed historically with [facility] and has no residual shunt. She does not require antibiotic prophylaxis. She had a left MCA presumed embolic stroke in 2012 with right sided weakness which she has recovered from. Cardiac event monitor at that time did not reveal any atrial fibrillation or flutter. Stroke remains cryptogenic likely embolic and she has been on ASA since that time with no recurrent clinical stroke. Notably this summer she had a brief spell with speech difficulty for which she had a CT head. In addition to the old left frontal stroke there was a second old small stroke in the right frontal lobe, as well as basal ganglia calcifications.     Histories:   She has a past medical history of Abnormal finding, Cholesteatoma, Cholesteatoma (1976), Down's syndrome, Dysfunction of eustachian tube (05/03/2005), Hearing impairment, Seizures (CMS Dx), Stroke (CMS Dx), Unspecified conductive hearing loss (05/03/2005), and Wears glasses.    She has a past surgical  history that includes Cardiac surgery (1972) and insertion pe tubes (1976).    Her family history includes Cancer in her mother and another family member; Diabetes in her maternal grandmother; Hearing loss in an other family member; Hypertension in her mother; Other in her cousin.    She reports that she has never smoked. She has never used smokeless tobacco. She reports that she does not drink alcohol and does not use drugs.    ROS:   Review of Systems   Constitutional: Negative for chills, fever, malaise/fatigue and weight loss.   HENT: Negative for congestion, ear pain, nosebleeds, sore throat and tinnitus.    Eyes: Negative for blurred vision and double vision.   Respiratory: Negative for cough, hemoptysis, sputum production, shortness of breath and wheezing.    Cardiovascular: Negative for chest pain, palpitations, orthopnea, claudication, leg swelling and PND.   Gastrointestinal: Negative for abdominal pain, blood in stool, constipation, diarrhea, heartburn, nausea and vomiting.   Genitourinary: Negative for dysuria, frequency and urgency.   Musculoskeletal: Positive for falls. Negative for back pain, joint pain and myalgias.   Skin: Negative for rash.   Neurological: Negative for dizziness, sensory change, speech change, focal weakness, loss of consciousness, weakness and headaches.   Endo/Heme/Allergies: Negative for environmental allergies. Does not bruise/bleed easily.   Psychiatric/Behavioral: Negative for depression and suicidal ideas. The patient is not nervous/anxious.        Allergies:   Patient has no known allergies.    Medications:     Outpatient Encounter Medications as  of 03/27/2020   Medication Sig Dispense Refill   ??? aspirin 81 MG chewable tablet Chew 81 mg by mouth daily.     ??? multivitamin (MULTIPLE VITAMINS) tablet Take 1 tablet by mouth daily.            ??? pravastatin (PRAVACHOL) 10 MG tablet Take 1 tablet (10 mg total) by mouth daily. NEEDS PHYSICAL PRIOR TO NEXT REFILL 90 tablet 0     No  facility-administered encounter medications on file as of 03/27/2020.        Objective:       Blood pressure 118/70, pulse 57, weight 122 lb 1.6 oz (55.4 kg), SpO2 100 %.    Physical Exam   Constitutional: No distress.   Syndromic appearance   HENT:   Nose: Nose normal.   Mouth/Throat: Oropharynx is clear.   Neck: No JVD present.   Cardiovascular: Normal rate, regular rhythm, S1 normal, S2 normal, normal heart sounds and normal pulses. Exam reveals no gallop and no friction rub.   No murmur heard.  Pulmonary/Chest: Effort normal and breath sounds normal. She has no wheezes. She has no rales. She exhibits no tenderness.   Abdominal: Soft. She exhibits no distension. There is no abdominal tenderness.   Musculoskeletal:         General: No tenderness or edema. Normal range of motion.      Cervical back: Normal range of motion and neck supple.   Neurological: She is alert and oriented to person, place, and time. She exhibits a cognitive deficit.   Skin: Skin is warm and dry.       Lab Review:   CBC:    Lab Results   Component Value Date    HGB 14.9 10/09/2019    HCT 43.6 10/09/2019    PLT 284 10/09/2019    WBC 4.0 10/09/2019    MCV 100.8 (H) 10/09/2019    MCH 34.4 (H) 10/09/2019       RENAL:    Lab Results   Component Value Date    NA 144 10/09/2019    K 4.1 10/09/2019    CL 104 10/09/2019    CO2 30 10/09/2019    BUN 13 10/09/2019    CREATININE 0.79 10/09/2019    GLUCOSE 100 10/09/2019    ALBUMIN 4.3 10/09/2019    CALCIUM 9.3 10/09/2019       LIVER:    Lab Results   Component Value Date    AST 22 10/09/2019    ALT 23 10/09/2019    BILITOT 1.0 10/09/2019    ALBUMIN 4.3 10/09/2019    ALKPHOS 62 10/09/2019       LIPIDS:    Lab Results   Component Value Date    CHOLTOT 171 10/09/2019    LDL 81 10/09/2019    HDL 70 10/09/2019    TRIG 329 10/09/2019         Prior Diagnostic Testing:  TTE:  7/21  Study Conclusions   - Left ventricle: The cavity size is at the upper limits of normal. Wall thickness is normal.   ????Systolic  function was mildly reduced. The estimated ejection fraction was in the range of 45% to   ????50%. Diffuse hypokinesis.   - Aortic valve: Trivial regurgitation.   - Mitral valve: Mild regurgitation.   - Right ventricle: The cavity size is normal. Systolic function was reduced. TAPSE: 1.4cm.   - Pulmonary arteries: Systolic pressure could not be accurately estimated.   - Inferior vena cava: The  vessel was normal in size. The respirophasic diameter changes were in the   ????normal range (>= 50%), consistent with normal central venous pressure.   - Pericardium, extracardiac: A trivial pericardial effusion is identified.   - Patient with history of Trisomy 21, ASD, VSD repair. There is no shunt on color Doppler on the   ????limited views obtained.        Assessment and Plan/Orders:     1. 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 Status post patch closure of VSD   -as above, no residual shunt, antibiotic prophylaxis not required      3. Cryptogenic stroke (CMS Dx)   -reviewed imaging results and neurology note; reportedly the right frontal infarct was present on 2012 brain MRI indicating there has likely not been any recurrent stroke since her clinical presentation in 2012  -she has had multiple cardiac structural evaluations in the past including at [facility] supporting that there is no residual intracardiac shunting, thus I think a repeat structural evaluation with TEE in the absence of recurrent clinical stroke would be low yield  -she is at risk for atrial fibrillation though none has been detected clinically or on event monitor after her stroke; similarly I think repeat rhythm monitoring would likely be low-yield  -in the event of recurrent stroke, I would recommend a repeat 30-day event monitor or a loop recorder implant  -continuation of ASA for secondary prevention is recommended, with currently no indication to escalate  therapy to [facility]      4. Mixed hyperlipidemia   -discussed with patient's mother that trisomy 43 with apoE4 have increased dyslipidemia and LDL and premature atherosclerosis; currently her LDL is at goal      5. Cardiomyopathy, unspecified type (CMS Dx)         Return in about 1 year (around 03/27/2021).

## 2020-06-10 ENCOUNTER — Ambulatory Visit: Admit: 2020-06-10 | Discharge: 2020-06-10 | Payer: MEDICARE

## 2020-06-10 DIAGNOSIS — E7800 Pure hypercholesterolemia, unspecified: Secondary | ICD-10-CM

## 2020-06-10 NOTE — Unmapped (Signed)
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

## 2020-06-10 NOTE — Unmapped (Signed)
Doing well - fine to compete.

## 2020-06-10 NOTE — Unmapped (Signed)
Progress Note    Chief Complaint:  Chief Complaint   Patient presents with   ??? Follow-up     paperwork for special olympics       History of Present Illness:  50 y.o. female with history of down's syndrome, CVA with recovered functioning, HLD here for sports evaluation.     Problem   Hld (Hyperlipidemia)    On pravastatin 10  06/10/2020:   Lab Results   Component Value Date    CHOLTOT 171 10/09/2019    TRIG 101 10/09/2019    HDL 70 10/09/2019    LDL 81 10/09/2019          Cva (Cerebral Vascular Accident) (Cms Dx)    On ASA, pravastatin 10  2/2 h/o repaired VSD  10/09/2019: had spell of 30 seconds of altered speech a few months ago. Resolved quickly. Mom wondering if having TIAs.   06/10/2020: had repeat MRI done with stroke that was possibly new since 2012, saw neuro who reviewed imaging and thought it was actually there on last imaging as well. Saw cards who agreed no indication for full AC. On ASA currently and agrees with ongoing therapy.  Lab Results   Component Value Date    CHOLTOT 152 02/24/2015    TRIG 86 02/24/2015    HDL 62 02/24/2015    LDL 73 02/24/2015          Down's Syndrome    Lives with mom who is guardian.  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 Plays this weekly without any difficulty.   Lab Results   Component Value Date    WBC 4.0 10/09/2019    HGB 14.9 10/09/2019    HCT 43.6 10/09/2019    MCV 100.8 (H) 10/09/2019    PLT 284 10/09/2019     Lab Results   Component Value Date    TSH 1.01 10/09/2019          Bmi 26.0-26.9,Adult    10/09/2019: Weight stable from last year. Discussed healthy eating.   Wt Readings from Last 3 Encounters:   06/20/18 118 lb 4.8 oz (53.7 kg)   01/26/18 117 lb (53.1 kg)   09/27/16 129 lb 9.6 oz (58.8 kg)              HM:   Health Maintenance Due   Topic Date Due   ??? Immunization: COVID-19 (1) Never done   ??? Immunization: Pneumococcal (1 of 2 - PPSV23) Never done   ??? Immunization: DTaP/Tdap/Td (6 - Tdap) 07/16/2003   ??? Immunization: Zoster (1 of 2)  Never done   ??? 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     Down's syndrome  Doing well - fine to compete.       Prior to the scheduled appointment, I reviewed the patient's recent laboratory values, active medical problems, and recent office visits in preparation for the visit today.         Number and Complexity of Problems (Level 4)  During this encounter, I addressed 2 or more stable chronic illnesses    Amount and/or  Complexity of Data Ordered, Reviewed, or Analyzed (Level 4)  I reviewed 2 external note(s) from: Cards, neuro.  I reviewed 3+ test(s) including:A1c, lipids, renal.

## 2020-07-28 ENCOUNTER — Ambulatory Visit: Admit: 2020-07-28 | Discharge: 2020-07-28 | Payer: MEDICARE

## 2020-07-28 DIAGNOSIS — J019 Acute sinusitis, unspecified: Secondary | ICD-10-CM

## 2020-07-28 MED ORDER — AMOXicillin-clavulanate (AUGMENTIN) 400-57 mg/5 mL suspension
400-57 | Freq: Two times a day (BID) | ORAL | 0 refills | Status: AC
Start: 2020-07-28 — End: 2020-08-04

## 2020-07-28 NOTE — Unmapped (Signed)
Progress Note    Chief Complaint:  Chief Complaint   Patient presents with   ??? Cough     covid test neg on 3-27. started march 22. on cold and flu med. temp 99.1-99.4   ??? Sore Throat   ??? Nasal Congestion     with post nasal drip        History of Present Illness:  50 y.o. female with history of down syndrome, CVA here for uri sx.     Problem   Acute Bacterial Sinusitis    07/28/2020: Nicole Chen to music on 3/18 and bowling on 3/20. Sick since 3/22 with fever, rhinorrhea, cough/sore throat. Mom got same sx on 3/23. Got covid tested at urgent care on 3/27 and was negative. 3/29 mom went to her doc and tested negative for covid. Nicole Chen is still coughing. Cough is non-productive as far as mom can tell. +nasal congestion. Trying mucinex without much relief. Coughing a lot at nighttime. Still having sore throat. Pt unable to tell if her drainage is purulent or not d/t communication delay.          HM:   Health Maintenance Due   Topic Date Due   ??? Immunization: COVID-19 (1) Never done   ??? Immunization: Pneumococcal (1 of 2 - PPSV23) Never done   ??? Immunization: DTaP/Tdap/Td (6 - Tdap) 07/16/2003   ??? Immunization: Zoster (1 of 2) Never done       Current Outpatient Medications on File Prior to Visit   Medication Sig Dispense Refill   ??? aspirin 81 MG chewable tablet Chew 81 mg by mouth daily.     ??? multivitamin (MULTIPLE VITAMINS) tablet Take 1 tablet by mouth daily.            ??? pravastatin (PRAVACHOL) 10 MG tablet Take 1 tablet (10 mg total) by mouth daily. NEEDS PHYSICAL PRIOR TO NEXT REFILL 90 tablet 0     No current facility-administered medications on file prior to visit.       Review of systems:  Review of Systems    Physical Exam:  Physical Exam    Assessment and Plan:    Acute bacterial sinusitis  Could be viral bronchitis, but given duration, will treat for acute sinusitis. Given duration of symptoms, a bacterial etiology is favored. Will treat with 7 days of antibiotics. Advised nasal saline to help with symptoms. Pt is  instructed to call for no improvement in sx.         Prior to the scheduled appointment, I reviewed the patient's recent laboratory values, active medical problems, and recent office visits in preparation for the visit today.     This was a Phone conversation, in lieu of an in-person visit. The patient provided verbal consent to participate in the telehealth visit.   I spent 10 minutes speaking with the patient, conducting an interview, performing a limited exam, and educating the patient on my assessment and plan. I also spent 10 minutes, on the same day as the encounter, preparing to see the patient (eg, review of tests), ordering medications, tests, or procedures and documenting clinical information in the electronic or other health record.        Number and Complexity of Problems (Level 3)  During this encounter, I addressed 1 acute uncomplicated illness or injury    Amount and/or Complexity of Data Ordered, Reviewed, or Analyzed (Level 2)  I reviewed minimal or no data.    Risk of Complication and/or Morbidity or Mortality of Patient  Management (Level  4)  The patient's management entails Moderate risk of complications and/or morbidity or mortality.    Overall LOS:  3

## 2020-07-28 NOTE — Unmapped (Signed)
Could be viral bronchitis, but given duration, will treat for acute sinusitis. Given duration of symptoms, a bacterial etiology is favored. Will treat with 7 days of antibiotics. Advised nasal saline to help with symptoms. Pt is instructed to call for no improvement in sx.

## 2020-08-08 NOTE — Unmapped (Signed)
Cough can persist for weeks after a URI. Ok to take OTC delsym, robitussin, dayquil

## 2020-08-08 NOTE — Unmapped (Signed)
Pt's mom calling    Pt just finished the abx.yest., that she was rx'd.      She Still has raspy cough in throat    A little drippy nose, but not as bad    No high temp.     Mom asking if she should Cont med, or is there an OTC med she should take   ( ? )       Pharm on file is corr.

## 2020-08-08 NOTE — Unmapped (Signed)
Returned the call to pt's mother and informed.

## 2020-08-29 NOTE — Unmapped (Signed)
Pt's mother would like to schedule a follow-up for Cholesteatoma, they were told to follow-up in 3-4 years and the first available was in August for Dr Orlin Hilding at 8am and they are not able to come in at 8am and they are wanting to see if we can get them in any sooner. Caller requests return call at [phone].

## 2020-09-02 NOTE — Unmapped (Signed)
Left voice message to schedule appt

## 2020-09-03 NOTE — Unmapped (Signed)
Left another voice message to schedule appt

## 2020-09-05 NOTE — Unmapped (Signed)
Spoke with Renea Ee scheduled appt.

## 2020-09-29 MED ORDER — pravastatin (PRAVACHOL) 10 MG tablet
10 | ORAL_TABLET | ORAL | 3 refills | Status: AC
Start: 2020-09-29 — End: ?

## 2020-09-29 NOTE — Unmapped (Signed)
Last Refilled: 10/08/19  Last visit with Provider: 07/28/2020 Si Gaul, MD  Next appointment in this department: Visit date not found  Last Medicare Annual Wellness Visit: 10/09/2019     Last Office Visit: 07/28/2020

## 2020-10-07 NOTE — Unmapped (Signed)
Spoke with mother rescheduled audio will check for cancellations for Dr Orlin Hilding .. she did not want any other Dr .

## 2020-10-13 ENCOUNTER — Encounter: Attending: Otology & Neurotology

## 2020-10-13 ENCOUNTER — Ambulatory Visit: Payer: MEDICARE | Attending: Audiologist

## 2020-10-20 NOTE — Unmapped (Signed)
Spoke with patient, rescheduled audio appointment for 7/5

## 2020-10-21 ENCOUNTER — Ambulatory Visit: Payer: MEDICARE | Attending: Audiologist-Hearing Aid Fitter

## 2020-10-23 NOTE — Unmapped (Signed)
Spoke with patients mother, having a head time right now. Scheduled in URGENT spot for ear cleaning. And Audio to follow. PLEASE DO NOT CANCEL/CHANGE THIS APPOINTMENT, if you have questions or concerns please reach out to Buffy .Marland Kitchen Thanks

## 2020-10-23 NOTE — Unmapped (Signed)
Spoke with

## 2020-10-28 ENCOUNTER — Ambulatory Visit: Payer: MEDICARE | Attending: Audiologist

## 2020-11-18 ENCOUNTER — Ambulatory Visit: Admit: 2020-11-18 | Discharge: 2020-11-18 | Payer: MEDICARE | Attending: Otology & Neurotology

## 2020-11-18 ENCOUNTER — Ambulatory Visit: Admit: 2020-11-18 | Discharge: 2020-11-18 | Payer: MEDICARE | Attending: Audiologist

## 2020-11-18 ENCOUNTER — Ambulatory Visit: Admit: 2020-11-19 | Discharge: 2020-11-24 | Payer: MEDICAID | Attending: Audiologist

## 2020-11-18 DIAGNOSIS — H903 Sensorineural hearing loss, bilateral: Secondary | ICD-10-CM

## 2020-11-18 DIAGNOSIS — H7191 Unspecified cholesteatoma, right ear: Secondary | ICD-10-CM

## 2020-11-18 DIAGNOSIS — H906 Mixed conductive and sensorineural hearing loss, bilateral: Secondary | ICD-10-CM

## 2020-11-18 NOTE — Unmapped (Signed)
See scanned audiogram.    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

## 2020-11-18 NOTE — Unmapped (Signed)
Hearing Aid Check    Appt added on today  Patient here with her Mom and Sister (?)  Mom states that they are getting ready to move to [facility] in a month.   Right hearing aid is not working - changing batteries did not help.   She is also interested in new hearing aids for Nicole Chen.     Both aids were cleaned today.    Unable to revive the right aid in the office - needs repair.    Patient has [place] MEDICAID.  I will not be able to obtain prior authorization, order and fit patient with new hearing aids before she leaves for [place].   I can get current aid repaired.    Mom wants me to do that.    She wants to pick up aid in [place] - this office is closer to her     Plan:  1)  Will have aid repaired and sent directly to [facility].  She can pick up there without an appt.   2)  I did email Rondell Reams, AuD to be on the lookout so she can contact patient when ready for pick up  3)  I did suggest she obtain a copy of last few audiograms when she picks up hearing aid so they can give to patient's new Audiologist in [facility]  4)  Dr. Orlin Hilding gave them a card to help them get ENT in [facility] per patient.  I suggested she find an Biomedical scientist with ENT that she chooses to help coordinate care.   5)  Mom asked if [facility] Medicaid had good hearing aid coverage.  I advised her that every state is different - she would need to find that out.      Hearing Aid Repair Request    Billing Information    Manufacturer:  [employer]  Date Sent:    11/19/20  Date Back:            Patient & Instrument Information    Patient Name:       Nicole Chen  Make and Model:  [employer] ITE  Serial # Right:       [id number]  Serial # Left:            Return for Credit:       Warranty Information    In Warranty:                     No  Out of Warranty Repair:    Rush Service:                  No    Repair Information    Reason for Repair:          Dead  Dispenser Comments and Instruction:  Patient to pick up in the [place] office when back from repair.

## 2020-11-18 NOTE — Unmapped (Addendum)
CC: impacted cerumen, cholesteatoma hx    History of Present Illness:  Patient ID: Nicole Chen is a 50 y.o. female. Patient is doing well. Wearing bilateral hearing aids. No concerns and no significant changes.  Here with mom and sister.  Has hearing loss  No c.o otorrhea or otalgia    Past Medical History:  She has a past medical history of Abnormal finding, Cholesteatoma, Cholesteatoma (1976), Down's syndrome, Dysfunction of eustachian tube (05/03/2005), Hearing impairment, Seizures (CMS Dx), Stroke (CMS Dx), Unspecified conductive hearing loss (05/03/2005), and Wears glasses.   Hx of CVA in the past with resolution of deficits  HLD hx    Past Surgical History:  She has a past surgical history that includes Cardiac surgery (1972) and insertion pe tubes (1976).  Family History:  Her family history includes Cancer in her mother and another family member; Diabetes in her maternal grandmother; Hearing loss in an other family member; Hypertension in her mother; Other in her cousin.  Social History:  She reports that she has never smoked. She has never used smokeless tobacco. She reports that she does not drink alcohol and does not use drugs.    Medications:  Current Outpatient Medications   Medication Sig Dispense Refill   ??? aspirin 81 MG chewable tablet Chew 81 mg by mouth daily.     ??? multivitamin (MULTIPLE VITAMINS) tablet Take 1 tablet by mouth daily.            ??? pravastatin (PRAVACHOL) 10 MG tablet TAKE 1 TABLET(10 MG) BY MOUTH DAILY 90 tablet 3     No current facility-administered medications for this visit.     Allergies:  Patient has no known allergies.    Physical Exam:   General: Well-developed, well-nourished female in no acute distress. Difficult to converse with, family present for assistance.   Lymphatic: No lymphadenopathy on neck palpation.   Neck: Overall appearance NL. No masses. Neck symmetric with NL tracheal position. No crepitus. No masses, tenderness, or enlargement of thyroid on palpation.  Salivary glands NL size.   Skin: Visual examination and palpation of the scalp, neck, and head reveals no significant rashes, ulcerations, nodules, or lesions.   Ears: External ears have a normal appearance with no masses or lesions.   Tuning fork tests performed at 512 Hz. Weber right. Could not test Rinne on patient; concern about reliability  Neurologic: Facial nerve function grade I. Other cranial nerves grossly WNL.     Procedure Note:   Otomicroscopic exam was done of both EAC's:  Right: EAC normal, dry wax removed from EAC, no signs of cholesteatoma; TM with post-surgical graft changes, hypomobile; middle ear clear  Left: EAC normal, dry wax removed from EAC, no sign of cholesteatoma; TM with mild retraction , myringosclerosis, mobile; middle ear with fluid present    Assessment and Plan:   Dear Drs. Dahlia Bailiff (PCP), [name] ([facility]), Veva Holes [place], [place], [place])  50 y.o. 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 Goal BMI <25 kg/m2.    Use few drops of baby oil and few drops of hydrogen peroxide once per week in each ear  Patient is wearing bilateral hearing aids, continue to use these  Discussed possibility of placing an ear tube in the left ear for left mucoid effusion  Last Audiogram was in  2018: mixed hearing loss AU  Will need a repeat audiogram either way, especially if planning to place ear tubes  Can attempt to place an ear tube in the left ear in clinic if patient would tolerate. Can try to place before you leave to move for [place], otherwise this can be placed in [place] by Dr. Mauricio Po.  Audiogram planned for 12:30pm today  For the rest of your life, there is always a risk of recurrence of cholesteatoma due to your history of cholesteatoma. You should continue to follow-up with an [facility] physician every 1-2 years to  monitor for this.  If you are moving to [place] and close to [place], Dr. Veva Holes is an ear specialist you can follow-up with there.  Patient seen with Dr. Jacquelyne Balint, R3.      I have seen and examined the patient with resident/fellow/NP/PA . I have discussed with him/her and agree with the findings and plan as documented in my assessment and plan.     Medical Decision Making:   [x] review/order radiology tests   [x] review/order other diagnostic or tx interventions   [x] discussed case with another provider (letter sent)   [x] Hearing Test Reviewed: yes   [] High risk of loss of major body function  Patient Education:   * verbal information and/or pamphlet/ literature given to patient.   Risks & Benefits:   * Risks, benefits and treatment options discussed with patient

## 2020-11-18 NOTE — Unmapped (Addendum)
Assessment and Plan:   Dear Drs. Dahlia Bailiff (PCP), [name] ([facility]), Veva Holes [place], [place], [place])  50 y.o. 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 Goal BMI <25 kg/m2.    Use few drops of baby oil and few drops of hydrogen peroxide once per week in each ear  Patient is wearing bilateral hearing aids, continue to use these  Discussed possibility of placing an ear tube in the left ear for left mucoid effusion  Last Audiogram was in 2018: mixed hearing loss AU  Will need a repeat audiogram either way, especially if planning to place ear tubes  Can attempt to place an ear tube in the left ear in clinic if patient would tolerate. Can try to place before you leave to move for [place], otherwise this can be placed in [place] by Dr. Mauricio Po.  Audiogram planned for 12:30pm today  For the rest of your life, there is always a risk of recurrence of cholesteatoma due to your history of cholesteatoma. You should continue to follow-up with an [facility] physician every 1-2 years to monitor for this.  If you are moving to [place] and close to [place], Dr. Veva Holes is an ear specialist you can follow-up with there.  Patient seen with Dr. Jacquelyne Balint, R3.

## 2020-12-04 NOTE — Unmapped (Signed)
Patients mother would like copies of the last office visit notes they are moving to [place] and she will need this for the new ENT they will be seeing

## 2020-12-11 NOTE — Unmapped (Signed)
Handled.   

## 2020-12-11 NOTE — Unmapped (Signed)
Spoke with patients mom and mailed out a copy of last office notes and audio. Moving to [place]

## 2021-02-02 ENCOUNTER — Ambulatory Visit: Admit: 2021-02-02 | Discharge: 2021-02-02 | Payer: MEDICARE

## 2021-02-02 DIAGNOSIS — R140 Abdominal distension (gaseous): Secondary | ICD-10-CM

## 2021-02-02 NOTE — Unmapped (Signed)
[place] in 2016 with some liver dx, otherwise normal. Concern for ascites causing abdominal distension. Repeat abd [place].

## 2021-02-02 NOTE — Unmapped (Signed)
Progress Note    Chief Complaint:  Chief Complaint   Patient presents with   ??? Nail Problem     Bilat toenail nail fungus? Nails getting thick, mom Renea Ee is speaking for the pt, mom doesn't think she has having pain in toenails. Sxs were a lot worse then mom tried applying otc meds helped slightly        History of Present Illness:  50 y.o. female with history of down syndrome, HLD, h/o stroke here for rash on foot.     Planning on moving to [facility] to live with her one daughter. The older daughter sued for guardianship over Crystallynn.     Problem   Toenail Fungus    02/02/2021: having significant thickening of the toenails. Denies pain. Tried OTC topicals and seemingly a bit better.      Abdominal Distension    02/02/2021: feels like belly is getting bigger and bigger without any weight gain anywhere else. Not eating more, staying away from sweets for the most part.      Acute Bacterial Sinusitis (Resolved)       HM:   Health Maintenance Due   Topic Date Due   ??? Immunization: COVID-19 (1) Never done   ??? Immunization: Pneumococcal (1 - PCV) Never done   ??? Immunization: DTaP/Tdap/Td (6 - Tdap) 07/16/2003   ??? Cervical Cancer Screening/Pap Smear (MyChart)  07/15/2014   ??? Colorectal Cancer Screening (MyChart)  Never done   ??? Mammogram (MyChart)  Never done   ??? Immunization: Zoster (1 of 2) Never done   ??? Annual Medicare Wellness Visit  10/08/2020   ??? Immunization: Influenza (MyChart) (1) 12/25/2020       Review of systems:  Review of Systems    Physical Exam:  Physical Exam        Assessment and Plan:    Toenail fungus  Picture c/w mild toe nail infection. Continue with topical since asymptomatic. Hold on PO meds for now.     Abdominal distension  [place] in 2016 with some liver dx, otherwise normal. Concern for ascites causing abdominal distension. Repeat abd [place].       Prior to the scheduled appointment, I reviewed the patient's recent laboratory values, active medical problems, and recent office visits in preparation for the  visit today.     This was a Phone conversation, in lieu of an in-person visit. The patient provided verbal consent to participate in the telehealth visit.   I spent 13 minutes speaking with the patient, conducting an interview, performing a limited exam, and educating the patient on my assessment and plan. I also spent 10 minutes, on the same day as the encounter, preparing to see the patient (eg, review of tests), ordering medications, tests, or procedures and documenting clinical information in the electronic or other health record.        Number and Complexity of Problems (Level 4)  During this encounter, I addressed 1 undiagnosed new problem with uncertain prognosis    Amount and/or Complexity of Data Ordered, Reviewed, or Analyzed (Level 4)  I reviewed 3+ test(s) including:US 2016, liver, cbc.        Risk of Complication and/or Morbidity or Mortality of Patient Management (Level  4)  The patient's management entails Moderate risk of complications and/or morbidity or mortality.    Overall LOS:  4

## 2021-02-02 NOTE — Unmapped (Signed)
Picture c/w mild toe nail infection. Continue with topical since asymptomatic. Hold on PO meds for now.

## 2021-02-17 ENCOUNTER — Ambulatory Visit: Payer: MEDICARE

## 2021-02-24 ENCOUNTER — Ambulatory Visit: Payer: MEDICARE

## 2021-02-27 ENCOUNTER — Inpatient Hospital Stay: Admit: 2021-02-27 | Discharge: 2021-03-03 | Payer: MEDICARE

## 2021-02-27 DIAGNOSIS — R140 Abdominal distension (gaseous): Secondary | ICD-10-CM

## 2021-02-27 NOTE — Unmapped (Signed)
Pt needs a renewal on her emotions support letter. Please update and mail to pt and mother

## 2021-02-27 NOTE — Unmapped (Signed)
done

## 2021-03-02 NOTE — Unmapped (Signed)
Called the pt back, spoke to pts mother  Letter done by [place] and once signed on Tuesday it will be put in mail to pt  Done

## 2021-03-27 ENCOUNTER — Ambulatory Visit: Payer: MEDICARE | Attending: Cardiovascular Disease

## 2022-12-16 IMAGING — MR MRI BRAIN W/O CONTRAST
6 series · 48 of 48 positions shown · IV contrast (Off)
Comparison: none

MRI OF THE BRAIN WITHOUT CONTRAST
CLINICAL HISTORY: Headache.
TECHNIQUE: Multisequential multiplanar imaging was performed of the brain, including diffusion-weighted imaging. Examination was ordered with and without gadolinium administration, however, the patient's family refused contrast at this time. Also, the patient became somewhat noncooperative near the end of the examination.

[Series 2: T1 · sagittal · 5.0mm · 0.90mm/px · 8 of 17 slices shown (1 of 2)]
[im 1/17]
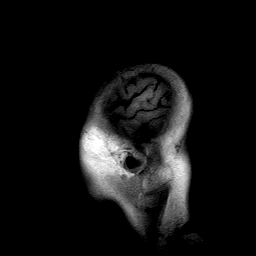
[im 3/17]
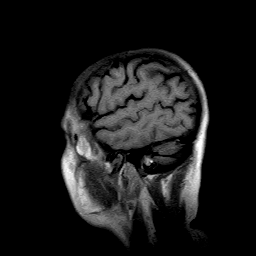
[im 5/17]
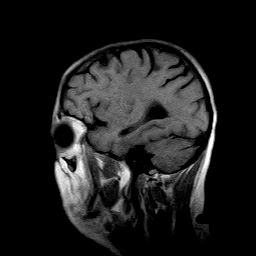
[im 7/17]
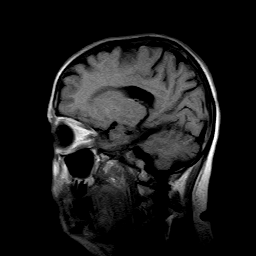
[im 10/17]
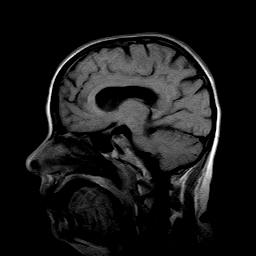
[im 12/17]
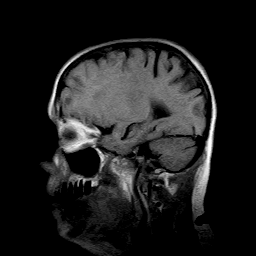
[im 14/17]
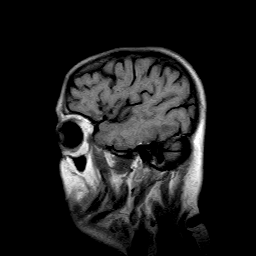
[im 17/17]
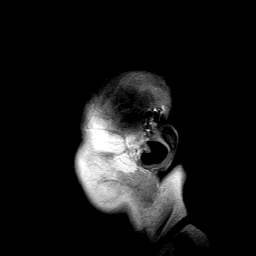

[Series 3: T2 · axial · 5.0mm · 0.86mm/px · z∈[-13,+120]mm · 8 of 20 slices shown]
[im 1/20]
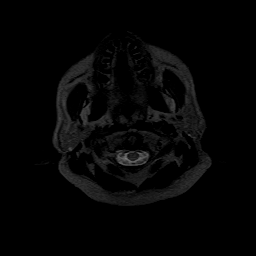
[im 3/20]
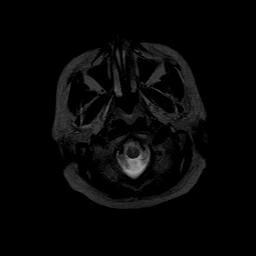
[im 6/20]
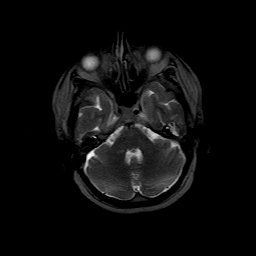
[im 9/20]
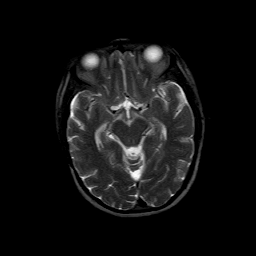
[im 11/20]
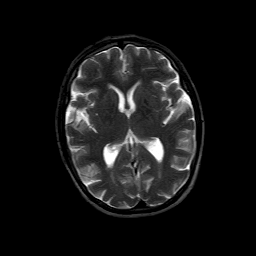
[im 14/20]
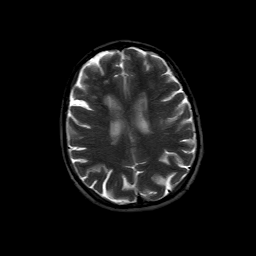
[im 17/20]
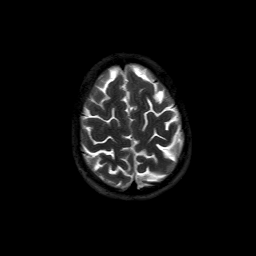
[im 20/20]
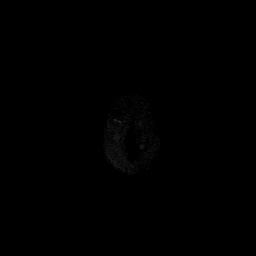

[Series 4: T1 · axial · 5.0mm · 0.86mm/px · z∈[-13,+120]mm · 8 of 20 slices shown (2 of 2)]
[im 1/20]
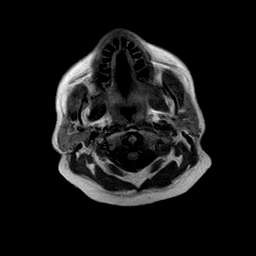
[im 3/20]
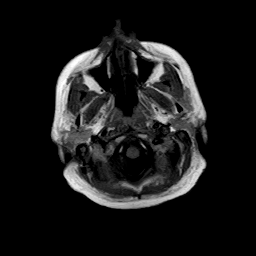
[im 6/20]
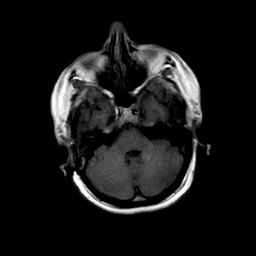
[im 9/20]
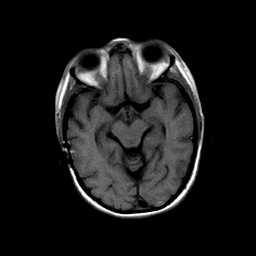
[im 11/20]
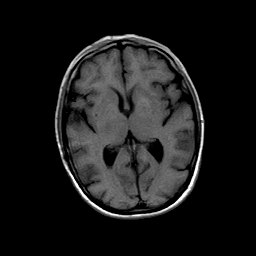
[im 14/20]
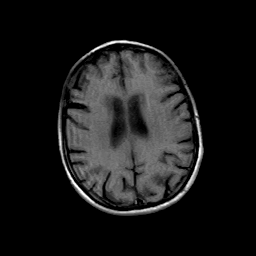
[im 17/20]
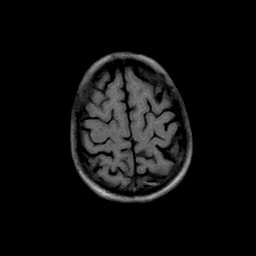
[im 20/20]
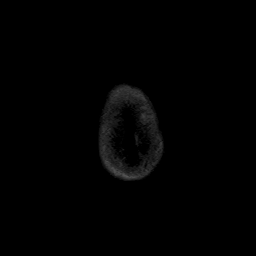

[Series 11: DWI · axial · 6.0mm · 1.88mm/px · z∈[-18,+118]mm · 8 of 18 slices shown]
[im 1/18]
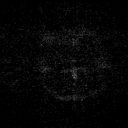
[im 3/18]
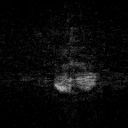
[im 5/18]
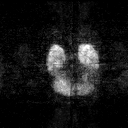
[im 8/18]
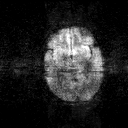
[im 10/18]
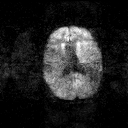
[im 13/18]
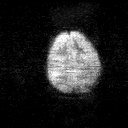
[im 15/18]
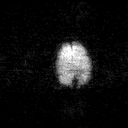
[im 18/18]
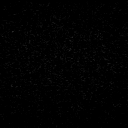

[Series 12: ADC · axial · 6.0mm · 1.88mm/px · z∈[-18,+118]mm · 8 of 18 slices shown]
[im 1/18]
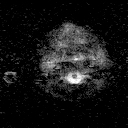
[im 3/18]
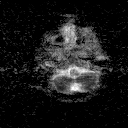
[im 5/18]
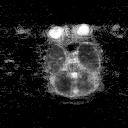
[im 8/18]
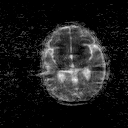
[im 10/18]
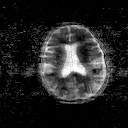
[im 13/18]
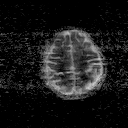
[im 15/18]
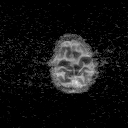
[im 18/18]
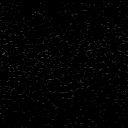

[Series 13: FLAIR · axial · 5.0mm · 0.86mm/px · z∈[-13,+120]mm · 8 of 20 slices shown]
[im 1/20]
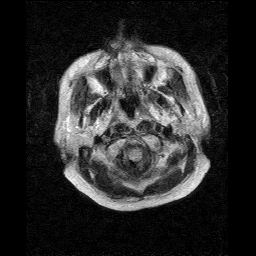
[im 3/20]
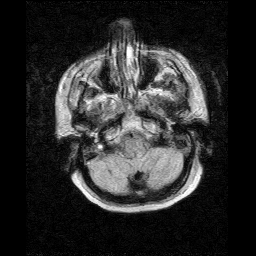
[im 6/20]
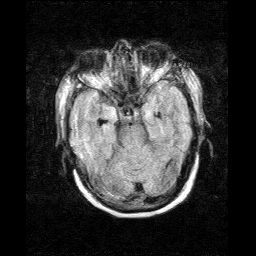
[im 9/20]
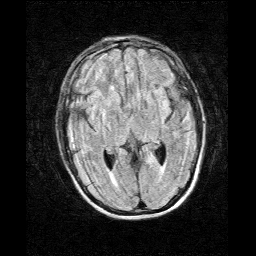
[im 11/20]
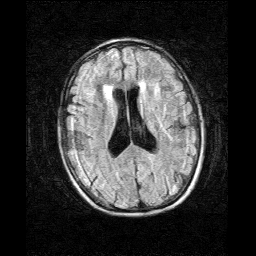
[im 14/20]
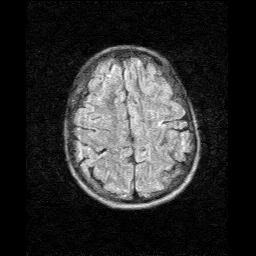
[im 17/20]
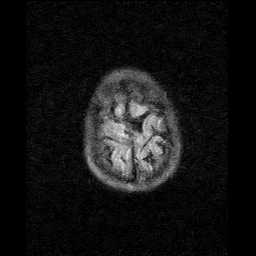
[im 20/20]
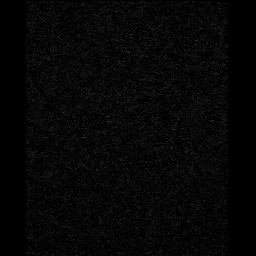

[48 of 48 positions shown; findings below may reference images not displayed]

FINDINGS: Examination is limited secondary to motion artifact. There is no MRI evidence of hemorrhage, mass effect, midline shift, extra-axial fluid collections, or hydrocephalus. There does appear to be significant demyelination in the periventricular and deep white matter with more focal demyelination noted in the left high parietal region. There is no evidence of restricted diffusion.
IMPRESSION: 1.
Evidence of significant demyelination in the periventricular and deep white matter consistent with small vessel ischemic change and/or ischemic leukomalacia. More focal area of demyelination in the left high parietal region may represent more focal ischemic event. As mentioned, the patient’s family refused contrast at this time and therefore we cannot evaluate for enhancing lesion. 

2.
No evidence of acute or subacute ischemia or other cause of restricted diffusion.
